# Patient Record
Sex: Female | Born: 1940
Health system: Southern US, Community
[De-identification: ages and names within clinical notes are randomized; demographics above are authoritative.]

## PROBLEM LIST (undated history)

## (undated) DIAGNOSIS — I10 Essential (primary) hypertension: Secondary | ICD-10-CM

## (undated) DIAGNOSIS — E785 Hyperlipidemia, unspecified: Secondary | ICD-10-CM

## (undated) DIAGNOSIS — E079 Disorder of thyroid, unspecified: Secondary | ICD-10-CM

## (undated) DIAGNOSIS — F419 Anxiety disorder, unspecified: Secondary | ICD-10-CM

## (undated) HISTORY — DX: Anxiety disorder, unspecified: F41.9

## (undated) HISTORY — DX: Disorder of thyroid, unspecified: E07.9

## (undated) HISTORY — DX: Essential (primary) hypertension: I10

## (undated) HISTORY — DX: Hyperlipidemia, unspecified: E78.5

## (undated) HISTORY — PX: TUBAL LIGATION: SHX77

---

## 2000-12-21 ENCOUNTER — Encounter: Payer: Self-pay | Admitting: Internal Medicine

## 2000-12-21 LAB — CONVERTED CEMR LAB: Pap Smear: NORMAL

## 2004-02-15 ENCOUNTER — Encounter: Payer: Self-pay | Admitting: Internal Medicine

## 2010-05-01 ENCOUNTER — Encounter: Payer: Self-pay | Admitting: Internal Medicine

## 2010-05-01 DIAGNOSIS — F39 Unspecified mood [affective] disorder: Secondary | ICD-10-CM

## 2010-05-01 DIAGNOSIS — I10 Essential (primary) hypertension: Secondary | ICD-10-CM | POA: Insufficient documentation

## 2010-05-02 ENCOUNTER — Ambulatory Visit: Payer: Self-pay | Admitting: Internal Medicine

## 2010-05-02 DIAGNOSIS — R002 Palpitations: Secondary | ICD-10-CM

## 2010-05-02 LAB — HM MAMMOGRAPHY

## 2010-09-01 ENCOUNTER — Ambulatory Visit: Payer: Self-pay | Admitting: Internal Medicine

## 2010-09-01 DIAGNOSIS — E039 Hypothyroidism, unspecified: Secondary | ICD-10-CM

## 2010-12-24 ENCOUNTER — Telehealth: Payer: Self-pay | Admitting: Family Medicine

## 2011-01-18 LAB — CONVERTED CEMR LAB
ALT: 20 units/L (ref 0–35)
AST: 30 units/L (ref 0–37)
Alkaline Phosphatase: 70 units/L (ref 39–117)
Bilirubin, Direct: 0.2 mg/dL (ref 0.0–0.3)
Creatinine, Ser: 0.6 mg/dL (ref 0.4–1.2)
Eosinophils Relative: 3 % (ref 0.0–5.0)
GFR calc non Af Amer: 109.58 mL/min (ref >60)
Glucose, Bld: 93 mg/dL (ref 70–99)
Lymphocytes Relative: 26.3 % (ref 12.0–46.0)
Monocytes Absolute: 0.6 10*3/uL (ref 0.1–1.0)
Monocytes Relative: 5.5 % (ref 3.0–12.0)
Neutrophils Relative %: 64.7 % (ref 43.0–77.0)
Phosphorus: 3.4 mg/dL (ref 2.3–4.6)
Platelets: 263 10*3/uL (ref 150.0–400.0)
Potassium: 4.1 meq/L (ref 3.5–5.1)
RBC: 4.31 M/uL (ref 3.87–5.11)
Sodium: 141 meq/L (ref 135–145)
Total Bilirubin: 1.3 mg/dL — ABNORMAL HIGH (ref 0.3–1.2)
WBC: 11.5 10*3/uL — ABNORMAL HIGH (ref 4.5–10.5)

## 2011-01-20 NOTE — Assessment & Plan Note (Signed)
Summary: 3-4 MONTH FOLLOW UPR/BH   Vital Signs:  Patient profile:   70 year old female Weight:      164 pounds Temp:     98.2 degrees F oral Pulse rate:   64 / minute Pulse rhythm:   regular BP sitting:   140 / 80  (left arm) Cuff size:   regular  Vitals Entered By: Mervin Hack CMA Duncan Dull) (September 01, 2010 12:38 PM) CC: 3-22month follow-up   History of Present Illness: Has felt fine Doesn't check BP but feels normal No headaches No chest pain No SOB  Had been walking 2 miles per day but then had ankle problems (former injury) so has slowed down on it some Wears good support shoes  History of low thyroid many years ago Didn't tolerate the meds No skin or nail problems  anxiety is better since moving back to [place] still ges some symptoms Only has needed 2 of the anxiety pills   Allergies: No Known Drug Allergies  Past History:  Past medical, surgical, family and social histories (including risk factors) reviewed for relevance to current acute and chronic problems.  Past Medical History: Anxiety Hypertension-2002 Hypothyroidism  Past Surgical History: Reviewed history from 05/01/2010 and no changes required. Vaginal Delivery times 3 Tubal ligation-after third delivery  Family History: Reviewed history from 05/02/2010 and no changes required. Father died at 66 from respiratory problems Mother deceased at 47, CAD Two brothers, one sister CAD in daughter (after diet pills) HTN-- -Son and died of kidney failure Breast cancer -- Maternal Grandmother and Maternal Aunt Colon Cancer: none  Social History: Reviewed history from 05/02/2010 and no changes required. Retired as IT consultant and various other businesses Divorced: after widowed  Three other children Current Smoker: Occasionally, socially (1 pack per week) Alcohol use-yes, Wine occ  Has living will. Daughter Mackey Birchwood has health care POA. Would accept resuscitation would not  want feeding tube  Review of Systems       had bout of sciatica about 10 years ago Remembers being on thyroid med many years ago--made her sick sleeps okay  Physical Exam  General:  alert and normal appearance.   Neck:  supple, no masses, no thyromegaly, no carotid bruits, and no cervical lymphadenopathy.   Lungs:  normal respiratory effort, no intercostal retractions, no accessory muscle use, and normal breath sounds.   Heart:  normal rate, regular rhythm, no murmur, and no gallop.   Abdomen:  soft and non-tender.   Extremities:  no edema Psych:  normally interactive, good eye contact, not anxious appearing, and not depressed appearing.     Impression & Recommendations:  Problem # 1:  HYPERTENSION (ICD-401.9) Assessment Unchanged fine without meds no need to restart  BP today: 140/80 Prior BP: 120/80 (05/02/2010)  Labs Reviewed: K+: 4.1 (05/02/2010) Creat: : 0.6 (05/02/2010)     Problem # 2:  HYPOTHYROIDISM (ICD-244.9) Assessment: Comment Only  past history didn't tolerate meds only will Rx if free T4 is low  Orders: Venipuncture ([address]) TLB-T4 (Thyrox), Free [phone])  Problem # 3:  ANXIETY (ICD-300.00) Assessment: Improved better now since back in [place] rarely needs the meds  Her updated medication list for this problem includes:    Clonazepam 1 Mg Tabs (Clonazepam) .Marland Kitchen... Take one tablet by mouth daily as needed for nerves  Complete Medication List: 1)  Clonazepam 1 Mg Tabs (Clonazepam) .... Take one tablet by mouth daily as needed for nerves 2)  Calcium 500 Mg Tabs (Calcium) .... Take one tablet  by mouth daily  Patient Instructions: 1)  Please schedule a follow-up appointment in 1 year for annual wellness visit  Current Allergies (reviewed today): No known allergies

## 2011-01-20 NOTE — Assessment & Plan Note (Signed)
Summary: PT TO RE-ESTABLISH/CLE   Vital Signs:  Patient profile:   70 year old female Height:      59 inches Weight:      162 pounds BMI:     32.84 Temp:     98.3 degrees F oral Pulse rate:   82 / minute Pulse rhythm:   regular BP sitting:   120 / 80  (left arm) Cuff size:   regular  Vitals Entered By: Mervin Hack CMA Duncan Dull) (2010/05/31 11:44 AM) CC: patient to re-establish care   History of Present Illness: Had moved to [place] 5 years ago to take care of dad He now died and she is back in area  she is generally healthy Vegetarian and careful with diet Plans to join gym and start exercise program  has still been on lisinopril for HTN was changed to 10mg  about 2 years ago wonders about whether she needs this  anxiety is only intermittent uses tranquilizer very rarely  takes a number of vitamin supplements as well  Allergies: No Known Drug Allergies  Past History:  Past Medical History: Last updated: 05/01/2010 Anxiety Hypertension-2002  Past Surgical History: Last updated: 05/01/2010 Vaginal Delivery times 3 Tubal ligation-after third delivery  Family History: Last updated: May 31, 2010 Father died at 2 from respiratory problems Mother deceased at 4, CAD Two brothers, one sister CAD in daughter (after diet pills) HTN-- -Son and died of kidney failure Breast cancer -- Maternal Grandmother and Maternal Aunt Colon Cancer: none  Social History: Last updated: 05/31/2010 Retired as IT consultant and various other businesses Divorced: after widowed  Three other children Current Smoker: Occasionally, socially (1 pack per week) Alcohol use-yes, Wine occ  Has living will. Daughter Mackey Birchwood has health care POA. Would accept resuscitation would not want feeding tube  Family History: Father died at 55 from respiratory problems Mother deceased at 11, CAD Two brothers, one sister CAD in daughter (after diet pills) HTN-- -Son and died of kidney  failure Breast cancer -- Maternal Grandmother and Maternal Aunt Colon Cancer: none  Social History: Retired as IT consultant and various other businesses Divorced: after widowed  Three other children Current Smoker: Occasionally, socially (1 pack per week) Alcohol use-yes, Wine occ  Has living will. Daughter Mackey Birchwood has health care POA. Would accept resuscitation would not want feeding tube  Review of Systems General:  weight down 25# in past 5 years Sleeps well wears seat blet. Eyes:  Denies double vision and vision loss-1 eye. ENT:  Complains of ringing in ears; denies decreased hearing; occ tinnitus Partial upper denture--regular with dentist. CV:  Complains of palpitations; denies chest pain or discomfort, difficulty breathing at night, difficulty breathing while lying down, fainting, lightheadness, and shortness of breath with exertion; only gets shaky and palpitations with anxiety problems (since son's death). Resp:  Denies cough and shortness of breath. GI:  Denies abdominal pain, bloody stools, change in bowel habits, dark tarry stools, indigestion, nausea, and vomiting. GU:  Denies dysuria and incontinence. MS:  Denies joint pain and joint swelling. Derm:  Denies lesion(s) and rash. Neuro:  Denies headaches, numbness, tingling, and weakness. Psych:  Complains of anxiety; denies depression. Heme:  Denies abnormal bruising and enlarge lymph nodes. Allergy:  Denies seasonal allergies and sneezing.  Contraindications/Deferment of Procedures/Staging:    Test/Procedure: Colonoscopy    Reason for deferment: patient declined     Test/Procedure: PAP Smear    Reason for deferment: patient declined     Test/Procedure: FLU VAX  Reason for deferment: patient declined     Test/Procedure: Pneumovax vaccine    Reason for deferment: patient declined     Test/Procedure: Mammogram    Reason for deferment: patient declined   Physical Exam  General:  alert and normal  appearance.   Eyes:  pupils equal, pupils round, and pupils reactive to light.   Mouth:  no erythema, no exudates, and no lesions.   Neck:  supple, no masses, no thyromegaly, no carotid bruits, and no cervical lymphadenopathy.   Lungs:  normal respiratory effort and normal breath sounds.   Heart:  normal rate, regular rhythm, no murmur, and no gallop.   Abdomen:  soft and non-tender.   Msk:  no joint tenderness and no joint swelling.   Pulses:  1+ in feet Extremities:  no edema Neurologic:  alert & oriented X3, strength normal in all extremities, and gait normal.   Skin:  no rashes and no suspicious lesions.   Axillary Nodes:  No palpable lymphadenopathy Psych:  normally interactive, good eye contact, not anxious appearing, and not depressed appearing.     Impression & Recommendations:  Problem # 1:  HYPERTENSION (ICD-401.9) Assessment Comment Only BP has been good on low dose--actually taking only 10mg   will try off and then recheck  The following medications were removed from the medication list:    Lisinopril 20 Mg Tabs (Lisinopril) .Marland Kitchen... Take one tablet by mouth daily  BP today: 120/80  Problem # 2:  PALPITATIONS (ICD-785.1) Assessment: New  seems to be just related to anxiety will check labs  Orders: EKG w/ Interpretation ([address]) TLB-Renal Function Panel (80069-RENAL) TLB-CBC Platelet - w/Differential (85025-CBCD) TLB-Hepatic/Liver Function Pnl (80076-HEPATIC) TLB-TSH (Thyroid Stimulating Hormone) (84443-TSH) Venipuncture (98119)  Problem # 3:  ANXIETY (ICD-300.00) Assessment: Unchanged uses meds only occ  Her updated medication list for this problem includes:    Clonazepam 1 Mg Tabs (Clonazepam) .Marland Kitchen... Take one tablet by mouth daily as needed for nerves  Complete Medication List: 1)  Clonazepam 1 Mg Tabs (Clonazepam) .... Take one tablet by mouth daily as needed for nerves 2)  Calcium 500 Mg Tabs (Calcium) .... Take one tablet by mouth daily  Patient  Instructions: 1)  Please schedule a follow-up appointment in 3-4  months .  Prescriptions: CLONAZEPAM 1 MG TABS (CLONAZEPAM) take one tablet by mouth daily as needed for nerves  #30 x 0   Entered and Authorized by:   Cindee Salt MD   Signed by:   Cindee Salt MD on 05/02/2010   Method used:   Print then Give to Patient   RxID:   [id number]   Current Allergies (reviewed today): No known allergies    EKG  Procedure date:  05/02/2010  Findings:      sinus @72  nonspecific inferolateral T wave flattening No comparison

## 2011-01-22 NOTE — Progress Notes (Signed)
Summary: clonazepam   Phone Note Refill Request Message from:  Patient on December 24, 2010 9:05 AM  Refills Requested: Medication #1:  CLONAZEPAM 1 MG TABS take one tablet by mouth daily as needed for nerves [facility] [address]   Initial call taken by: Melody Comas,  December 24, 2010 9:06 AM  Follow-up for Phone Call        ok to call in. Follow-up by: Eustaquio Boyden  MD,  December 24, 2010 9:07 AM  Additional Follow-up for Phone Call Additional follow up Details #1::        Rx called in as directed.  Additional Follow-up by: Janee Morn CMA (AAMA),  December 24, 2010 10:07 AM    Prescriptions: CLONAZEPAM 1 MG TABS (CLONAZEPAM) take one tablet by mouth daily as needed for nerves  #30 x 0   Entered and Authorized by:   Eustaquio Boyden  MD   Signed by:   Eustaquio Boyden  MD on 12/24/2010   Method used:   Telephoned to ...       [facility] (retail)       [address]       [place], [place]  [address]       Ph: [phone]       Fax: [phone]   RxID:   [id number]

## 2011-06-22 ENCOUNTER — Other Ambulatory Visit: Payer: Self-pay | Admitting: *Deleted

## 2011-06-22 MED ORDER — CLONAZEPAM 1 MG PO TABS: 1.0000 mg | ORAL_TABLET | Freq: Every day | ORAL | Status: DC | PRN

## 2011-06-22 NOTE — Telephone Encounter (Signed)
Okay #30 x 0 Should set up follow up in the next few months

## 2011-06-22 NOTE — Telephone Encounter (Signed)
rx called into pharmacy

## 2011-06-22 NOTE — Telephone Encounter (Signed)
Ok to refill klonopin 

## 2011-11-16 ENCOUNTER — Other Ambulatory Visit: Payer: Self-pay | Admitting: Internal Medicine

## 2011-11-16 NOTE — Telephone Encounter (Signed)
Spoke with patient and advised appt needed in earlier phone call.

## 2011-11-16 NOTE — Telephone Encounter (Signed)
Spoke with patient about scheduling and appointment and she states she's really busy and will call us when she can. Please advise.

## 2011-11-16 NOTE — Telephone Encounter (Signed)
We cannot refill a controlled substance without a visit She hasn't been seen in over a year

## 2011-11-24 ENCOUNTER — Telehealth: Payer: Self-pay | Admitting: Internal Medicine

## 2011-11-24 NOTE — Telephone Encounter (Signed)
Made an appointment for tomorrow for patient to be seen.

## 2011-11-25 ENCOUNTER — Encounter: Payer: Self-pay | Admitting: Internal Medicine

## 2011-11-25 ENCOUNTER — Ambulatory Visit (INDEPENDENT_AMBULATORY_CARE_PROVIDER_SITE_OTHER): Payer: MEDICARE | Admitting: Internal Medicine

## 2011-11-25 DIAGNOSIS — E039 Hypothyroidism, unspecified: Secondary | ICD-10-CM

## 2011-11-25 DIAGNOSIS — I10 Essential (primary) hypertension: Secondary | ICD-10-CM

## 2011-11-25 DIAGNOSIS — F411 Generalized anxiety disorder: Secondary | ICD-10-CM

## 2011-11-25 LAB — CBC WITH DIFFERENTIAL/PLATELET
Basophils Absolute: 0 10*3/uL (ref 0.0–0.1)
Eosinophils Absolute: 0.3 10*3/uL (ref 0.0–0.7)
Hemoglobin: 15 g/dL (ref 12.0–15.0)
Lymphocytes Relative: 30.5 % (ref 12.0–46.0)
MCHC: 33.7 g/dL (ref 30.0–36.0)
Neutro Abs: 5.3 10*3/uL (ref 1.4–7.7)
Neutrophils Relative %: 59.3 % (ref 43.0–77.0)
RDW: 13.1 % (ref 11.5–14.6)

## 2011-11-25 LAB — BASIC METABOLIC PANEL
BUN: 9 mg/dL (ref 6–23)
CO2: 27 mEq/L (ref 19–32)
Calcium: 9 mg/dL (ref 8.4–10.5)
Creatinine, Ser: 0.5 mg/dL (ref 0.4–1.2)
Glucose, Bld: 94 mg/dL (ref 70–99)

## 2011-11-25 LAB — HEPATIC FUNCTION PANEL
ALT: 33 U/L (ref 0–35)
Total Bilirubin: 0.9 mg/dL (ref 0.3–1.2)

## 2011-11-25 MED ORDER — CLONAZEPAM 1 MG PO TABS: 0.5000 mg | ORAL_TABLET | Freq: Two times a day (BID) | ORAL | Status: DC | PRN

## 2011-11-25 NOTE — Assessment & Plan Note (Signed)
Has had persistent low levels Could be influencing blood pressure Will start dose and double if still low

## 2011-11-25 NOTE — Assessment & Plan Note (Signed)
Uses clonazepam very occ Will refill

## 2011-11-25 NOTE — Progress Notes (Signed)
  Subjective:    Patient ID: Daisy Long, female    DOB: 04-Aug-1941, 70 y.o.   MRN: [id number]  HPI Called yesterday and staff note urinary symptoms She doesn't think she has infection but she does have a pressure sensation after voiding (like there is still urine there) Goes back at least 2 months No dysuria, abd pain or hematuria  Doesn't check BP No headaches No chest pain No SOB No set exercise---just does household tasks  Reviewed hypothyroidism diagnosis Felt terrible on synthroid then---"it made me feel terrible" She stopped it   Had quit smoking but still smokes occ Not daily  Gets occ anxiety Uses the clonazepam prn Hasn't needed refill since July Has to move soon--expects more stress with this  Does not want cancer screening No bowel problems No blood in stool No breast masses  Current Outpatient Prescriptions on File Prior to Visit  Medication Sig Dispense Refill  . clonazePAM (KLONOPIN) 1 MG tablet Take 1 tablet (1 mg total) by mouth daily as needed.  30 tablet  0    No Known Allergies  Past Medical History  Diagnosis Date  . Anxiety   . Hypertension   . Thyroid disease     Past Surgical History  Procedure Date  . Tubal ligation   . Vaginal delivery     X3    Family History  Problem Relation Age of Onset  . Coronary artery disease Mother   . Coronary artery disease Daughter   . Breast cancer Maternal Aunt   . Breast cancer Maternal Grandmother     History   Social History  . Marital Status: Single    Spouse Name: N/A    Number of Children: 3  . Years of Education: N/A   Occupational History  . retired as IT consultant    Social History Main Topics  . Smoking status: Current Some Day Smoker    Types: Cigarettes  . Smokeless tobacco: Never Used  . Alcohol Use: Yes  . Drug Use: No  . Sexually Active: Not on file   Other Topics Concern  . Not on file   Social History Narrative   Has living will. Daughter Daisy Long has  health care POA.Would accept resuscitationwould not want feeding tube   Review of Systems Vegan--eats well Stable weight--had gained 10# and then lost it again Sleeps okay    Objective:   Physical Exam  Constitutional: She appears well-developed and well-nourished. No distress.  Neck: Normal range of motion. Neck supple. No thyromegaly present.  Cardiovascular: Normal rate, regular rhythm, normal heart sounds and intact distal pulses.  Exam reveals no gallop.   No murmur heard. Pulmonary/Chest: Effort normal and breath sounds normal. No respiratory distress. She has no wheezes. She has no rales.  Abdominal: Soft. There is no tenderness.  Musculoskeletal: Normal range of motion. She exhibits no edema and no tenderness.  Lymphadenopathy:    She has no cervical adenopathy.  Psychiatric: She has a normal mood and affect. Her behavior is normal. Judgment and thought content normal.          Assessment & Plan:

## 2011-11-25 NOTE — Assessment & Plan Note (Signed)
BP Readings from Last 3 Encounters:  11/25/11 176/99  09/01/10 140/80  05/02/10 120/80   Repeat by me 160/98 Discussed that rx will be necessary Needs to work on lifestyle and weight loss Check urine microal---start lisinopril 10 again if abnormal Recheck 3 months

## 2011-11-30 ENCOUNTER — Telehealth: Payer: Self-pay | Admitting: *Deleted

## 2011-11-30 ENCOUNTER — Encounter: Payer: Self-pay | Admitting: *Deleted

## 2011-11-30 MED ORDER — LEVOTHYROXINE SODIUM 50 MCG PO TABS: 50.0000 ug | ORAL_TABLET | Freq: Every day | ORAL | Status: DC

## 2011-11-30 NOTE — Telephone Encounter (Signed)
Spoke with patient and advised results rx sent to pharmacy by e-script letter mailed to patients home address with results.  

## 2011-11-30 NOTE — Telephone Encounter (Signed)
Message copied by Sueanne Margarita on Mon Nov 30, 2011 10:05 AM ------      Message from: Tillman Abide I      Created: Fri Nov 27, 2011  8:01 AM       Please call      The thyroid test is still low and shows evidence of increased brain stimulation of the thyroid to keep up--which may be influencing her blood pressure      I recommend starting levothyroxine 50 mcg----take 1/2 tab for 2 weeks then increase to full tab (1 year Rx)      Keep the follow up            The urine test shows no signs of kidney damage from the blood pressure so we can hold off on BP meds for now      Liver tests are fine      Blood count is normal

## 2011-12-03 ENCOUNTER — Emergency Department: Payer: Self-pay | Admitting: Unknown Physician Specialty

## 2011-12-07 ENCOUNTER — Ambulatory Visit (INDEPENDENT_AMBULATORY_CARE_PROVIDER_SITE_OTHER): Payer: MEDICARE | Admitting: Internal Medicine

## 2011-12-07 ENCOUNTER — Encounter: Payer: Self-pay | Admitting: Internal Medicine

## 2011-12-07 DIAGNOSIS — I10 Essential (primary) hypertension: Secondary | ICD-10-CM

## 2011-12-07 DIAGNOSIS — R002 Palpitations: Secondary | ICD-10-CM

## 2011-12-07 DIAGNOSIS — E039 Hypothyroidism, unspecified: Secondary | ICD-10-CM

## 2011-12-07 MED ORDER — LISINOPRIL 10 MG PO TABS: 5.0000 mg | ORAL_TABLET | Freq: Every day | ORAL | Status: DC

## 2011-12-07 NOTE — Assessment & Plan Note (Signed)
BP Readings from Last 3 Encounters:  12/07/11 138/70  11/25/11 176/99  09/01/10 140/80   Seemed to tolerate lisinopril but has run out Did take one yesterday

## 2011-12-07 NOTE — Assessment & Plan Note (Signed)
Had another clear cut reaction to levothyroxine Will hold off on other Rx This goes back 20 years Will recheck at next visit and also check T3---consider [place] thyroid

## 2011-12-07 NOTE — Assessment & Plan Note (Signed)
Not clear cut per her history today Was ER diagnosis though EKG is changed with some T wave inversions anteriorly Clinically doesn't seem to have CAD---will plan to recheck this at next visit

## 2011-12-07 NOTE — Progress Notes (Signed)
  Subjective:    Patient ID: Daisy Long, female    DOB: [birth year], 70 y.o.   MRN: [id number]  HPI Seen in ER due to palpitations last week ER records are reviewed Thinks it may have been related to starting very low dose of levothyroxine Weird feelings--had sense of head stuffiness and lightheadedness Slight blood in mucus when blew her nose  No trouble with heart No palpitations she feels---just a sense in her neck where her thyroid is No chest pain No SOB  Asks about [place] thyroid--Dr Enedina Finner in ER mentioned this I also mentioned cytomel  Current Outpatient Prescriptions on File Prior to Visit  Medication Sig Dispense Refill  . clonazePAM (KLONOPIN) 1 MG tablet Take 0.5 tablets (0.5 mg total) by mouth 2 (two) times daily as needed for anxiety.  30 tablet  0    Allergies  Allergen Reactions  . Levothyroxine Other (See Comments)    Increased blood pressure    Past Medical History  Diagnosis Date  . Anxiety   . Hypertension   . Thyroid disease     Past Surgical History  Procedure Date  . Tubal ligation   . Vaginal delivery     X3    Family History  Problem Relation Age of Onset  . Coronary artery disease Mother   . Coronary artery disease Daughter   . Breast cancer Maternal Aunt   . Breast cancer Maternal Grandmother     History   Social History  . Marital Status: Single    Spouse Name: N/A    Number of Children: 3  . Years of Education: N/A   Occupational History  . retired as IT consultant    Social History Main Topics  . Smoking status: Former Smoker    Types: Cigarettes  . Smokeless tobacco: Never Used  . Alcohol Use: Yes  . Drug Use: No  . Sexually Active: Not on file   Other Topics Concern  . Not on file   Social History Narrative   Has living will. Daughter Mackey Birchwood has health care POA.Would accept resuscitationwould not want feeding tube   Review of Systems Sleeping okay Appetite is okay     Objective:   Physical Exam    Constitutional: She appears well-developed and well-nourished. No distress.  Neck: Normal range of motion. Neck supple. No thyromegaly present.  Cardiovascular: Normal rate, regular rhythm and normal heart sounds.  Exam reveals no gallop.   No murmur heard. Pulmonary/Chest: Effort normal and breath sounds normal. No respiratory distress. She has no wheezes. She has no rales.  Abdominal: Soft. There is no tenderness.  Musculoskeletal: She exhibits no edema and no tenderness.  Lymphadenopathy:    She has no cervical adenopathy.  Psychiatric: She has a normal mood and affect. Her behavior is normal. Judgment and thought content normal.          Assessment & Plan:

## 2012-02-08 ENCOUNTER — Other Ambulatory Visit: Payer: Self-pay | Admitting: Internal Medicine

## 2012-02-08 NOTE — Telephone Encounter (Signed)
rx called into pharmacy

## 2012-02-08 NOTE — Telephone Encounter (Signed)
Okay #30 x 0 

## 2012-02-10 ENCOUNTER — Other Ambulatory Visit: Payer: Self-pay | Admitting: Internal Medicine

## 2012-02-23 ENCOUNTER — Ambulatory Visit: Payer: MEDICARE | Admitting: Internal Medicine

## 2012-03-22 ENCOUNTER — Other Ambulatory Visit: Payer: Self-pay | Admitting: Internal Medicine

## 2012-03-22 NOTE — Telephone Encounter (Signed)
Okay #30 x 0 Due for check up in May or June---please have her schedule

## 2012-03-22 NOTE — Telephone Encounter (Signed)
No answer at home number and no answering machine, left message with pharmacy that pt needs to schedule f/u appt rx called into pharmacy

## 2012-05-18 ENCOUNTER — Other Ambulatory Visit: Payer: Self-pay | Admitting: Internal Medicine

## 2012-05-18 NOTE — Telephone Encounter (Signed)
rx called into pharmacy Left message asking patient to schedule appt in next couple of months

## 2012-05-18 NOTE — Telephone Encounter (Signed)
Okay #30 x 0 Forgot to give her follow up after her last visit Is due for visit within the next couple of months

## 2012-07-25 ENCOUNTER — Other Ambulatory Visit: Payer: Self-pay | Admitting: Internal Medicine

## 2012-07-25 NOTE — Telephone Encounter (Signed)
Daisy Long ( please send back to [facility])  Pt's canceled last appt on 02/23/12, last seen 12/07/11, ok to refill? No future appts scheduled

## 2012-07-25 NOTE — Telephone Encounter (Signed)
rx called into pharmacy

## 2012-07-25 NOTE — Telephone Encounter (Signed)
Ok to refill #30, 0 refills in Dr. Karle Starch absence

## 2012-09-22 ENCOUNTER — Other Ambulatory Visit: Payer: Self-pay | Admitting: Family Medicine

## 2012-09-22 NOTE — Telephone Encounter (Signed)
Electronic refill request

## 2012-09-25 NOTE — Telephone Encounter (Signed)
Please call in

## 2012-09-26 NOTE — Telephone Encounter (Signed)
Medication phoned to pharmacy.  

## 2012-12-08 ENCOUNTER — Other Ambulatory Visit: Payer: Self-pay | Admitting: *Deleted

## 2012-12-09 NOTE — Telephone Encounter (Signed)
Okay #30 x 0 Have her set up appt--preferably physical

## 2012-12-09 NOTE — Telephone Encounter (Signed)
She hasn't been seen in 1 year. Will defer to PCP.

## 2012-12-12 MED ORDER — CLONAZEPAM 1 MG PO TABS: ORAL_TABLET | ORAL | Status: DC

## 2012-12-12 NOTE — Telephone Encounter (Signed)
Spoke with patient and advised results rx called into pharmacy appt scheduled 12/20/12 @ 10;30

## 2012-12-20 ENCOUNTER — Encounter: Payer: Self-pay | Admitting: Internal Medicine

## 2012-12-20 ENCOUNTER — Ambulatory Visit (INDEPENDENT_AMBULATORY_CARE_PROVIDER_SITE_OTHER): Payer: Medicare Other | Admitting: Internal Medicine

## 2012-12-20 VITALS — BP 130/90 | HR 84 | Temp 98.5°F | Ht 59.0 in | Wt 163.0 lb

## 2012-12-20 DIAGNOSIS — E039 Hypothyroidism, unspecified: Secondary | ICD-10-CM

## 2012-12-20 DIAGNOSIS — F411 Generalized anxiety disorder: Secondary | ICD-10-CM

## 2012-12-20 DIAGNOSIS — Z0000 Encounter for general adult medical examination without abnormal findings: Secondary | ICD-10-CM

## 2012-12-20 DIAGNOSIS — I10 Essential (primary) hypertension: Secondary | ICD-10-CM

## 2012-12-20 LAB — HEPATIC FUNCTION PANEL
Albumin: 4.2 g/dL (ref 3.5–5.2)
Alkaline Phosphatase: 63 U/L (ref 39–117)
Total Protein: 8.1 g/dL (ref 6.0–8.3)

## 2012-12-20 LAB — CBC WITH DIFFERENTIAL/PLATELET
Basophils Absolute: 0 10*3/uL (ref 0.0–0.1)
Basophils Relative: 0.5 % (ref 0.0–3.0)
Eosinophils Relative: 3.8 % (ref 0.0–5.0)
Lymphocytes Relative: 28.8 % (ref 12.0–46.0)
Monocytes Absolute: 0.7 10*3/uL (ref 0.1–1.0)
Neutrophils Relative %: 59.9 % (ref 43.0–77.0)
Platelets: 280 10*3/uL (ref 150.0–400.0)
RBC: 4.25 Mil/uL (ref 3.87–5.11)
WBC: 9.5 10*3/uL (ref 4.5–10.5)

## 2012-12-20 LAB — LIPID PANEL
HDL: 38.9 mg/dL — ABNORMAL LOW (ref >39.00)
VLDL: 48.8 mg/dL — ABNORMAL HIGH (ref 0.0–40.0)

## 2012-12-20 LAB — BASIC METABOLIC PANEL
BUN: 14 mg/dL (ref 6–23)
Calcium: 9.2 mg/dL (ref 8.4–10.5)
Creatinine, Ser: 0.6 mg/dL (ref 0.4–1.2)

## 2012-12-20 MED ORDER — LISINOPRIL 10 MG PO TABS: 5.0000 mg | ORAL_TABLET | Freq: Every day | ORAL | Status: DC

## 2012-12-20 NOTE — Assessment & Plan Note (Signed)
Healthy Declines all imms and cancer screening Stressed the importance of work up if any bowel changes, breast mass, etc

## 2012-12-20 NOTE — Assessment & Plan Note (Signed)
Has not done well with meds If no sig change from last year, will not treat since she feels great

## 2012-12-20 NOTE — Assessment & Plan Note (Signed)
Does well with occ clonazepam

## 2012-12-20 NOTE — Assessment & Plan Note (Signed)
BP Readings from Last 3 Encounters:  12/20/12 130/90  12/07/11 138/70  11/25/11 176/99   Good control on med No changes needed Due for labs

## 2012-12-20 NOTE — Progress Notes (Signed)
Subjective:    Patient ID: Daisy Long, female    DOB: 11-11-1941, 71 y.o.   MRN: [id number]  HPI Here for physical  Reviewed immunization and cancer screening She has declined all these interventions  Took the thyroid med for only 3 days  Stopped due to marked elevated BP Feels fine now  Checks her BP regularly at home Usually 135-140/80 or so On the lisinopril  Has some degree of anxiety Relates the worsening to her son's death (8 years ago) but even in her childhood had problems Uses the clonazepam every other day or so  Follows Karp Ornish's lifestyle measures Regular exercise  Current Outpatient Prescriptions on File Prior to Visit  Medication Sig Dispense Refill  . clonazePAM (KLONOPIN) 1 MG tablet TAKE ONE-HALF TABLET BY MOUTH TWICE DAILY AS NEEDED FOR ANXIETY  30 tablet  0  . lisinopril (PRINIVIL,ZESTRIL) 10 MG tablet Take 0.5 tablets (5 mg total) by mouth daily.  90 tablet  3    Allergies  Allergen Reactions  . Levothyroxine Other (See Comments)    Increased blood pressure    Past Medical History  Diagnosis Date  . Anxiety   . Hypertension   . Thyroid disease     Past Surgical History  Procedure Date  . Tubal ligation   . Vaginal delivery     X3    Family History  Problem Relation Age of Onset  . Coronary artery disease Mother   . Coronary artery disease Daughter   . Breast cancer Maternal Aunt   . Breast cancer Maternal Grandmother     History   Social History  . Marital Status: Widowed    Spouse Name: N/A    Number of Children: 3  . Years of Education: N/A   Occupational History  . retired as IT consultant    Social History Main Topics  . Smoking status: Former Smoker    Types: Cigarettes  . Smokeless tobacco: Never Used  . Alcohol Use: Yes  . Drug Use: No  . Sexually Active: Not on file   Other Topics Concern  . Not on file   Social History Narrative   Has living will. Daughter Mackey Birchwood has health care POA.Would  accept resuscitation but would not want prolonged artificial ventilationwould not want feeding tube    Review of Systems  Constitutional: Negative for fatigue and unexpected weight change.       Wears seat belt  HENT: Positive for dental problem and tinnitus. Negative for congestion and rhinorrhea.        Intermittent tinnitis--- may be worse after vitamin C Regular with dentist--some trouble with bottom teeth  Eyes: Negative for visual disturbance.       No diplopia or unilateral vision loss Recent exam  Respiratory: Negative for cough, chest tightness and shortness of breath.   Cardiovascular: Positive for palpitations. Negative for chest pain and leg swelling.       Palpitations occ after drinking wine (flutter)  Gastrointestinal: Negative for nausea, vomiting, abdominal pain, constipation and blood in stool.       No heartburn  Genitourinary: Negative for dysuria, urgency and difficulty urinating.       No sex--no problem  Musculoskeletal: Negative for back pain, joint swelling and arthralgias.  Skin: Negative for rash.       No suspicious lesions  Neurological: Negative for dizziness, syncope, weakness, light-headedness, numbness and headaches.  Hematological: Negative for adenopathy. Does not bruise/bleed easily.  Psychiatric/Behavioral: Negative for sleep disturbance and dysphoric  mood. The patient is nervous/anxious.        Objective:   Physical Exam  Constitutional: She is oriented to person, place, and time. She appears well-developed and well-nourished. No distress.  HENT:  Head: Normocephalic and atraumatic.  Right Ear: External ear normal.  Left Ear: External ear normal.  Mouth/Throat: Oropharynx is clear and moist. No oropharyngeal exudate.  Eyes: Conjunctivae normal and EOM are normal. Pupils are equal, round, and reactive to light.  Neck: Normal range of motion. Neck supple. No thyromegaly present.  Cardiovascular: Normal rate, regular rhythm, normal heart sounds  and intact distal pulses.  Exam reveals no gallop.   No murmur heard. Pulmonary/Chest: Effort normal and breath sounds normal. No respiratory distress. She has no wheezes. She has no rales.  Abdominal: Soft. There is no tenderness.  Genitourinary:       Mild cystic changes bilaterally in breasts but no worrisome masses  Musculoskeletal: Normal range of motion. She exhibits no edema and no tenderness.  Lymphadenopathy:    She has no cervical adenopathy.    She has no axillary adenopathy.  Neurological: She is alert and oriented to person, place, and time.  Skin: No rash noted. No erythema.  Psychiatric: She has a normal mood and affect. Her behavior is normal.          Assessment & Plan:

## 2012-12-23 ENCOUNTER — Other Ambulatory Visit: Payer: Self-pay | Admitting: Internal Medicine

## 2012-12-26 NOTE — Telephone Encounter (Signed)
Last refilled 12/08/12, is this too soon?

## 2012-12-26 NOTE — Telephone Encounter (Signed)
Yeah Check with her to see what happened

## 2012-12-26 NOTE — Telephone Encounter (Signed)
Spoke with patient and she never requested refill,

## 2013-01-16 ENCOUNTER — Other Ambulatory Visit: Payer: Self-pay | Admitting: Internal Medicine

## 2013-01-16 NOTE — Telephone Encounter (Signed)
Okay #30 x 1 

## 2013-01-16 NOTE — Telephone Encounter (Signed)
rx called into pharmacy

## 2013-01-16 NOTE — Telephone Encounter (Signed)
rx already called in

## 2013-03-22 ENCOUNTER — Other Ambulatory Visit: Payer: Self-pay | Admitting: Internal Medicine

## 2013-03-22 NOTE — Telephone Encounter (Signed)
rx called into pharmacy

## 2013-03-22 NOTE — Telephone Encounter (Signed)
Okay #30 x 1 

## 2013-05-22 ENCOUNTER — Other Ambulatory Visit: Payer: Self-pay | Admitting: Internal Medicine

## 2013-05-22 NOTE — Telephone Encounter (Signed)
Okay #30 x 1 

## 2013-05-22 NOTE — Telephone Encounter (Signed)
rx called into pharmacy

## 2013-07-21 ENCOUNTER — Other Ambulatory Visit: Payer: Self-pay | Admitting: Internal Medicine

## 2013-07-21 NOTE — Telephone Encounter (Signed)
rx called into pharmacy

## 2013-07-21 NOTE — Telephone Encounter (Signed)
Okay #30 x 1 

## 2013-07-31 ENCOUNTER — Encounter: Payer: Self-pay | Admitting: Radiology

## 2013-08-01 ENCOUNTER — Ambulatory Visit (INDEPENDENT_AMBULATORY_CARE_PROVIDER_SITE_OTHER): Payer: Medicare Other | Admitting: Internal Medicine

## 2013-08-01 ENCOUNTER — Encounter: Payer: Self-pay | Admitting: Internal Medicine

## 2013-08-01 VITALS — BP 140/80 | HR 80 | Temp 98.5°F | Wt 150.0 lb

## 2013-08-01 DIAGNOSIS — N8111 Cystocele, midline: Secondary | ICD-10-CM

## 2013-08-01 DIAGNOSIS — N8110 Cystocele, unspecified: Secondary | ICD-10-CM | POA: Insufficient documentation

## 2013-08-01 NOTE — Progress Notes (Signed)
  Subjective:    Patient ID: Daisy Long, female    DOB: Nov 24, 1941, 72 y.o.   MRN: [id number]  HPI Noticed a protrusion from vaginal--feels some pressure History of fibroids in past No pain No bleeding  Current Outpatient Prescriptions on File Prior to Visit  Medication Sig Dispense Refill  . clonazePAM (KLONOPIN) 1 MG tablet TAKE ONE-HALF TABLET BY MOUTH TWICE DAILY AS NEEDED  30 tablet  1  . lisinopril (PRINIVIL,ZESTRIL) 10 MG tablet Take 0.5 tablets (5 mg total) by mouth daily.  90 tablet  3   No current facility-administered medications on file prior to visit.    Allergies  Allergen Reactions  . Levothyroxine Other (See Comments)    Increased blood pressure    Past Medical History  Diagnosis Date  . Anxiety   . Hypertension   . Thyroid disease     Past Surgical History  Procedure Laterality Date  . Tubal ligation    . Vaginal delivery      X3    Family History  Problem Relation Age of Onset  . Coronary artery disease Mother   . Coronary artery disease Daughter   . Breast cancer Maternal Aunt   . Breast cancer Maternal Grandmother     History   Social History  . Marital Status: Widowed    Spouse Name: N/A    Number of Children: 3  . Years of Education: N/A   Occupational History  . retired as IT consultant    Social History Main Topics  . Smoking status: Former Smoker    Types: Cigarettes  . Smokeless tobacco: Never Used  . Alcohol Use: Yes  . Drug Use: No  . Sexually Active: Not on file   Other Topics Concern  . Not on file   Social History Narrative   Has living will. Daughter Mackey Birchwood has health care POA.   Would accept resuscitation but would not want prolonged artificial ventilation   would not want feeding tube                Review of Systems Not sexually active No urinary symptoms    Objective:   Physical Exam  Genitourinary:  Introitus appears fairly normal ?prominence just below the urethra Cervix in normal  position Uterus felt well and no apparent enlargement          Assessment & Plan:

## 2013-08-01 NOTE — Assessment & Plan Note (Signed)
Seems to be partial but bothering her (sensation) Clearly no uterine prolapse  Will proceed with gyn eval

## 2013-08-22 ENCOUNTER — Other Ambulatory Visit: Payer: Self-pay | Admitting: Internal Medicine

## 2013-08-22 NOTE — Telephone Encounter (Signed)
rx called into pharmacy The refill was not on the original rx per the pharmacy.

## 2013-08-22 NOTE — Telephone Encounter (Signed)
Had #30 with a refill done 8/1 Make sure they haven't filled the refill also If not, okay to fill #30 x 0 Will not give refills on this even with low number

## 2013-09-15 ENCOUNTER — Telehealth: Payer: Self-pay

## 2013-09-15 DIAGNOSIS — N8110 Cystocele, unspecified: Secondary | ICD-10-CM

## 2013-09-15 NOTE — Telephone Encounter (Signed)
Tina at Dr DeFrancisco office left v/m; pt was seen on 09/12/13 and Dr DeFrancisco recommended a surgical procedure TDH with anterior colopography(? Spelling); at appt pt was interested in surgical procedure; pts daughter called Dr Gar Ponto office and said pt is not interested in surgical procedure and does not want to return to Dr DeFrancisco for further care. Inetta Fermo is not sure if pt is going to continue the treatment Dr DeFrancisco recommended such as estrace cream.Wanted Dr Alphonsus Sias to be aware since Dr Alphonsus Sias did referral.

## 2013-09-15 NOTE — Telephone Encounter (Signed)
Discussed with daughter I felt her prolapse was mild and no reason to rush into surgery  Will refer to [facility] for a second opinion

## 2013-09-15 NOTE — Telephone Encounter (Signed)
Daisy Long pts daughter said Dr DeFrancisco told pt early into the office visit said pt needed to have uterus removed because she does not need it anymore. Daisy Long said after he examined pt he said he still wanted to do hysterectomy and get a persuary. Pt is not satisfied with the advice pt was given and does not feel like he answered pt's questions and request a referral to another specialist. Daisy Long request cb.

## 2013-09-19 NOTE — Telephone Encounter (Signed)
Appt made with [facility] and patient notified.

## 2013-09-20 ENCOUNTER — Other Ambulatory Visit: Payer: Self-pay | Admitting: Internal Medicine

## 2013-09-21 NOTE — Telephone Encounter (Signed)
Last filled 08/22/13 

## 2013-09-21 NOTE — Telephone Encounter (Signed)
Okay #30 x 0 

## 2013-09-22 NOTE — Telephone Encounter (Signed)
rx called into pharmacy

## 2013-10-20 ENCOUNTER — Other Ambulatory Visit: Payer: Self-pay | Admitting: Internal Medicine

## 2013-10-20 NOTE — Telephone Encounter (Signed)
Electronic refill request, please advise  

## 2013-10-21 NOTE — Telephone Encounter (Signed)
Okay #30 x 0 

## 2013-10-23 NOTE — Telephone Encounter (Signed)
rx called into pharmacy

## 2013-10-26 ENCOUNTER — Other Ambulatory Visit: Payer: Self-pay

## 2013-10-30 ENCOUNTER — Ambulatory Visit: Payer: Self-pay | Admitting: Obstetrics and Gynecology

## 2013-10-30 LAB — COMPREHENSIVE METABOLIC PANEL
Alkaline Phosphatase: 89 U/L (ref 50–136)
Anion Gap: 3 — ABNORMAL LOW (ref 7–16)
BUN: 7 mg/dL (ref 7–18)
Bilirubin,Total: 1 mg/dL (ref 0.2–1.0)
Calcium, Total: 8.6 mg/dL (ref 8.5–10.1)
Co2: 30 mmol/L (ref 21–32)
Creatinine: 0.52 mg/dL — ABNORMAL LOW (ref 0.60–1.30)
EGFR (African American): >60
Glucose: 87 mg/dL (ref 65–99)
Potassium: 4 mmol/L (ref 3.5–5.1)
SGPT (ALT): 24 U/L (ref 12–78)
Sodium: 138 mmol/L (ref 136–145)
Total Protein: 7.1 g/dL (ref 6.4–8.2)

## 2013-10-30 LAB — CBC
HCT: 39.5 % (ref 35.0–47.0)
HGB: 13.8 g/dL (ref 12.0–16.0)
MCH: 35.5 pg — ABNORMAL HIGH (ref 26.0–34.0)
MCHC: 35 g/dL (ref 32.0–36.0)
RDW: 12.3 % (ref 11.5–14.5)

## 2013-11-20 ENCOUNTER — Other Ambulatory Visit: Payer: Self-pay | Admitting: Internal Medicine

## 2013-11-20 NOTE — Telephone Encounter (Signed)
rx called into pharmacy

## 2013-11-20 NOTE — Telephone Encounter (Signed)
Okay #30 x 0 

## 2013-11-20 NOTE — Telephone Encounter (Signed)
Last filled 10/20/13

## 2013-12-22 ENCOUNTER — Other Ambulatory Visit: Payer: Self-pay | Admitting: Internal Medicine

## 2013-12-22 ENCOUNTER — Encounter: Payer: Medicare Other | Admitting: Internal Medicine

## 2013-12-22 NOTE — Telephone Encounter (Signed)
rx called into pharmacy

## 2013-12-22 NOTE — Telephone Encounter (Signed)
Ok to phone in klonipin 

## 2013-12-22 NOTE — Telephone Encounter (Signed)
Last filled 11/20/13 LETVAK PATIENT, Please send back to me for call in

## 2013-12-29 ENCOUNTER — Encounter: Payer: Self-pay | Admitting: Internal Medicine

## 2013-12-29 ENCOUNTER — Ambulatory Visit (INDEPENDENT_AMBULATORY_CARE_PROVIDER_SITE_OTHER): Payer: Medicare Other | Admitting: Internal Medicine

## 2013-12-29 VITALS — BP 130/80 | HR 64 | Temp 98.6°F | Ht 59.0 in | Wt 145.0 lb

## 2013-12-29 DIAGNOSIS — I10 Essential (primary) hypertension: Secondary | ICD-10-CM

## 2013-12-29 DIAGNOSIS — E785 Hyperlipidemia, unspecified: Secondary | ICD-10-CM

## 2013-12-29 DIAGNOSIS — E039 Hypothyroidism, unspecified: Secondary | ICD-10-CM

## 2013-12-29 DIAGNOSIS — F411 Generalized anxiety disorder: Secondary | ICD-10-CM

## 2013-12-29 DIAGNOSIS — Z0000 Encounter for general adult medical examination without abnormal findings: Secondary | ICD-10-CM

## 2013-12-29 LAB — HEPATIC FUNCTION PANEL
ALBUMIN: 4.2 g/dL (ref 3.5–5.2)
ALT: 24 U/L (ref 0–35)
AST: 33 U/L (ref 0–37)
Alkaline Phosphatase: 59 U/L (ref 39–117)
BILIRUBIN TOTAL: 1.4 mg/dL — AB (ref 0.3–1.2)
Bilirubin, Direct: 0.1 mg/dL (ref 0.0–0.3)
Total Protein: 7.8 g/dL (ref 6.0–8.3)

## 2013-12-29 LAB — BASIC METABOLIC PANEL
BUN: 9 mg/dL (ref 6–23)
CALCIUM: 9.1 mg/dL (ref 8.4–10.5)
CO2: 29 mEq/L (ref 19–32)
CREATININE: 0.6 mg/dL (ref 0.4–1.2)
Chloride: 103 mEq/L (ref 96–112)
GFR: 96.79 mL/min (ref >60.00)
GLUCOSE: 96 mg/dL (ref 70–99)
POTASSIUM: 4.1 meq/L (ref 3.5–5.1)
Sodium: 139 mEq/L (ref 135–145)

## 2013-12-29 LAB — CBC WITH DIFFERENTIAL/PLATELET
Basophils Absolute: 0 10*3/uL (ref 0.0–0.1)
Basophils Relative: 0.4 % (ref 0.0–3.0)
EOS ABS: 0.3 10*3/uL (ref 0.0–0.7)
Eosinophils Relative: 3.4 % (ref 0.0–5.0)
HCT: 43.3 % (ref 36.0–46.0)
Hemoglobin: 14.6 g/dL (ref 12.0–15.0)
Lymphocytes Relative: 28.1 % (ref 12.0–46.0)
Lymphs Abs: 2.4 10*3/uL (ref 0.7–4.0)
MCHC: 33.7 g/dL (ref 30.0–36.0)
MCV: 101.8 fl — ABNORMAL HIGH (ref 78.0–100.0)
MONO ABS: 0.6 10*3/uL (ref 0.1–1.0)
Monocytes Relative: 6.7 % (ref 3.0–12.0)
NEUTROS PCT: 61.4 % (ref 43.0–77.0)
Neutro Abs: 5.3 10*3/uL (ref 1.4–7.7)
Platelets: 251 10*3/uL (ref 150.0–400.0)
RBC: 4.25 Mil/uL (ref 3.87–5.11)
RDW: 12.2 % (ref 11.5–14.6)
WBC: 8.7 10*3/uL (ref 4.5–10.5)

## 2013-12-29 LAB — MICROALBUMIN / CREATININE URINE RATIO
CREATININE, U: 214.8 mg/dL
MICROALB UR: 0.9 mg/dL (ref 0.0–1.9)
Microalb Creat Ratio: 0.4 mg/g (ref 0.0–30.0)

## 2013-12-29 LAB — T4, FREE: FREE T4: 0.7 ng/dL (ref 0.60–1.60)

## 2013-12-29 LAB — LIPID PANEL
CHOL/HDL RATIO: 6
Cholesterol: 243 mg/dL — ABNORMAL HIGH (ref 0–200)
HDL: 39.3 mg/dL (ref >39.00)
Triglycerides: 135 mg/dL (ref 0.0–149.0)
VLDL: 27 mg/dL (ref 0.0–40.0)

## 2013-12-29 LAB — LDL CHOLESTEROL, DIRECT: LDL DIRECT: 180.3 mg/dL

## 2013-12-29 LAB — TSH: TSH: 7.78 u[IU]/mL — ABNORMAL HIGH (ref 0.35–5.50)

## 2013-12-29 NOTE — Progress Notes (Signed)
Subjective:    Patient ID: Daisy Long, female    DOB: Oct 23, 1941, 73 y.o.   MRN: [id number]  HPI Here for physical  Has some concerns about her meds Concerned about side effects with the clonazepam Was taking 1/2 bid--- now cut down to 1/2 daily Had some feelings of dizziness and not feeling right Now taking 1/4 tab once a day--with some skipped days No problems with the anxiety--chronic but not enough for meds  Not happy with the lisinopril Thinks it causes her tinnitus and she doesn't feel right Checking at home-- 140/75 and sometimes lower  Went to Dr Jean Rosenthal for second opinion on bladder Didn't push surgery No major symptoms so just observing for now  Current Outpatient Prescriptions on File Prior to Visit  Medication Sig Dispense Refill  . clonazePAM (KLONOPIN) 1 MG tablet TAKE ONE-HALF TABLET BY MOUTH TWICE DAILY AS NEEDED  30 tablet  0  . lisinopril (PRINIVIL,ZESTRIL) 10 MG tablet Take 0.5 tablets (5 mg total) by mouth daily.  90 tablet  3   No current facility-administered medications on file prior to visit.    Allergies  Allergen Reactions  . Levothyroxine Other (See Comments)    Increased blood pressure    Past Medical History  Diagnosis Date  . Anxiety   . Hypertension   . Thyroid disease   . Hyperlipidemia     Past Surgical History  Procedure Laterality Date  . Tubal ligation    . Vaginal delivery      X3    Family History  Problem Relation Age of Onset  . Coronary artery disease Mother   . Coronary artery disease Daughter   . Breast cancer Maternal Aunt   . Breast cancer Maternal Grandmother     History   Social History  . Marital Status: Widowed    Spouse Name: N/A    Number of Children: 3  . Years of Education: N/A   Occupational History  . retired as IT consultant    Social History Main Topics  . Smoking status: Former Smoker    Types: Cigarettes  . Smokeless tobacco: Never Used  . Alcohol Use: Yes  . Drug Use: No    . Sexual Activity: Not on file   Other Topics Concern  . Not on file   Social History Narrative   Has living will.    Daughter Mackey Birchwood has health care POA.   Would accept resuscitation but would not want prolonged artificial ventilation   Would not want feeding tube if cognitively unaware                  Review of Systems  Constitutional: Negative for fatigue.       Has lost more weight with Danielle Dess plan Wears seat belt  HENT: Positive for dental problem and tinnitus. Negative for congestion, hearing loss and rhinorrhea.        Needs some work on bottom teeth  Eyes: Negative for visual disturbance.       No diplopia or unilateral vision loss  Respiratory: Negative for cough, chest tightness and shortness of breath.   Cardiovascular: Negative for chest pain, palpitations and leg swelling.  Gastrointestinal: Negative for nausea, vomiting, abdominal pain, constipation and blood in stool.       No heartburn  Endocrine: Negative for cold intolerance and heat intolerance.  Genitourinary: Negative for dysuria, hematuria and difficulty urinating.  Musculoskeletal: Negative for arthralgias, back pain and joint swelling.  Skin: Negative for  rash.       No suspicious lesions  Allergic/Immunologic: Negative for environmental allergies and immunocompromised state.  Neurological: Negative for dizziness, syncope, weakness, numbness and headaches.  Hematological: Negative for adenopathy. Does not bruise/bleed easily.  Psychiatric/Behavioral: Negative for sleep disturbance and dysphoric mood. The patient is nervous/anxious.        Objective:   Physical Exam  Constitutional: She is oriented to person, place, and time. She appears well-developed and well-nourished. No distress.  HENT:  Head: Normocephalic and atraumatic.  Right Ear: External ear normal.  Left Ear: External ear normal.  Mouth/Throat: Oropharynx is clear and moist. No oropharyngeal exudate.  Eyes: Conjunctivae and  EOM are normal. Pupils are equal, round, and reactive to light.  Neck: Normal range of motion. Neck supple. No thyromegaly present.  Cardiovascular: Normal rate, regular rhythm and normal heart sounds.  Exam reveals no gallop.   No murmur heard. Faint pedal pulses  Pulmonary/Chest: Effort normal and breath sounds normal. No respiratory distress. She has no wheezes. She has no rales.  Abdominal: Soft. There is no tenderness.  Musculoskeletal: She exhibits no tenderness.  Lymphadenopathy:    She has no cervical adenopathy.  Neurological: She is alert and oriented to person, place, and time.  Skin: No rash noted. No erythema.  Psychiatric: She has a normal mood and affect. Her behavior is normal.          Assessment & Plan:

## 2013-12-29 NOTE — Assessment & Plan Note (Signed)
No symptoms ?try Armour thyroid if TSH up a lot

## 2013-12-29 NOTE — Assessment & Plan Note (Signed)
Healthy Still doesn't want any immunizations or cancer screening

## 2013-12-29 NOTE — Assessment & Plan Note (Signed)
BP Readings from Last 3 Encounters:  12/29/13 130/80  08/01/13 140/80  12/20/12 130/90   Side effects with med Discussed goal of 150/90 Recheck 3 months

## 2013-12-29 NOTE — Assessment & Plan Note (Signed)
Better  Will try off the med

## 2013-12-29 NOTE — Assessment & Plan Note (Signed)
Has maximized lifestyle improvements Doesn't want a statin

## 2013-12-29 NOTE — Progress Notes (Signed)
Pre-visit discussion using our clinic review tool. No additional management support is needed unless otherwise documented below in the visit note.  

## 2014-03-01 ENCOUNTER — Emergency Department: Payer: Self-pay | Admitting: Emergency Medicine

## 2014-03-01 LAB — CBC WITH DIFFERENTIAL/PLATELET
BASOS PCT: 0.6 %
Basophil #: 0.1 10*3/uL (ref 0.0–0.1)
EOS PCT: 3.4 %
Eosinophil #: 0.4 10*3/uL (ref 0.0–0.7)
HCT: 43.2 % (ref 35.0–47.0)
HGB: 14.7 g/dL (ref 12.0–16.0)
Lymphocyte #: 2.7 10*3/uL (ref 1.0–3.6)
Lymphocyte %: 24.2 %
MCH: 35.2 pg — ABNORMAL HIGH (ref 26.0–34.0)
MCHC: 34.1 g/dL (ref 32.0–36.0)
MCV: 103 fL — ABNORMAL HIGH (ref 80–100)
MONOS PCT: 6.5 %
Monocyte #: 0.7 x10 3/mm (ref 0.2–0.9)
NEUTROS ABS: 7.4 10*3/uL — AB (ref 1.4–6.5)
Neutrophil %: 65.3 %
Platelet: 237 10*3/uL (ref 150–440)
RBC: 4.19 10*6/uL (ref 3.80–5.20)
RDW: 12.5 % (ref 11.5–14.5)
WBC: 11.3 10*3/uL — ABNORMAL HIGH (ref 3.6–11.0)

## 2014-03-01 LAB — BASIC METABOLIC PANEL
Anion Gap: 3 — ABNORMAL LOW (ref 7–16)
BUN: 14 mg/dL (ref 7–18)
CO2: 28 mmol/L (ref 21–32)
Calcium, Total: 8.6 mg/dL (ref 8.5–10.1)
Chloride: 106 mmol/L (ref 98–107)
Creatinine: 0.63 mg/dL (ref 0.60–1.30)
EGFR (African American): >60
GLUCOSE: 120 mg/dL — AB (ref 65–99)
Osmolality: 275 (ref 275–301)
Potassium: 3.8 mmol/L (ref 3.5–5.1)
Sodium: 137 mmol/L (ref 136–145)

## 2014-03-01 LAB — URINALYSIS, COMPLETE
BILIRUBIN, UR: NEGATIVE
BLOOD: NEGATIVE
Bacteria: NONE SEEN
GLUCOSE, UR: NEGATIVE mg/dL (ref 0–75)
KETONE: NEGATIVE
Nitrite: NEGATIVE
Ph: 5 (ref 4.5–8.0)
Protein: NEGATIVE
RBC,UR: 5 /HPF (ref 0–5)
Specific Gravity: 1.023 (ref 1.003–1.030)
Squamous Epithelial: 10
WBC UR: 11 /HPF (ref 0–5)

## 2014-03-01 LAB — MAGNESIUM: Magnesium: 1.6 mg/dL — ABNORMAL LOW

## 2014-03-01 LAB — TROPONIN I: Troponin-I: <0.02 ng/mL

## 2014-03-22 ENCOUNTER — Ambulatory Visit (INDEPENDENT_AMBULATORY_CARE_PROVIDER_SITE_OTHER): Payer: Medicare Other | Admitting: Internal Medicine

## 2014-03-22 ENCOUNTER — Encounter: Payer: Self-pay | Admitting: Internal Medicine

## 2014-03-22 VITALS — BP 142/80 | HR 78 | Temp 97.7°F | Wt 146.0 lb

## 2014-03-22 DIAGNOSIS — I10 Essential (primary) hypertension: Secondary | ICD-10-CM

## 2014-03-22 DIAGNOSIS — F411 Generalized anxiety disorder: Secondary | ICD-10-CM

## 2014-03-22 NOTE — Progress Notes (Signed)
   Subjective:    Patient ID: Daisy Long, female    DOB: Sep 20, 1941, 73 y.o.   MRN: [id number]  HPI Here with daughter Paulette   Off the BP med Has been monitoring it regularly Usually about 140/80  No headaches No chest pain No SOB Has had some spells of lightheadedness when she changed from her [place] diet. Actually went to ER once -- no problems on eval No problems since then  Anxiety has been fine No problems off the med  No current outpatient prescriptions on file prior to visit.   No current facility-administered medications on file prior to visit.    Allergies  Allergen Reactions  . Levothyroxine Other (See Comments)    Increased blood pressure    Past Medical History  Diagnosis Date  . Anxiety   . Hypertension   . Thyroid disease   . Hyperlipidemia     Past Surgical History  Procedure Laterality Date  . Tubal ligation    . Vaginal delivery      X3    Family History  Problem Relation Age of Onset  . Coronary artery disease Mother   . Coronary artery disease Daughter   . Breast cancer Maternal Aunt   . Breast cancer Maternal Grandmother     History   Social History  . Marital Status: Widowed    Spouse Name: N/A    Number of Children: 3  . Years of Education: N/A   Occupational History  . 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 Smoking status: Former Smoker    Types: Cigarettes  . Smokeless tobacco: Never Used  . Alcohol Use: Yes  . Drug Use: No  . Sexual Activity: Not on file   Other Topics Concern  . Not on file   Social History Narrative   Has living will.    Daughter Mackey Birchwoodaulette has health care POA.   Would accept resuscitation but would not want prolonged artificial ventilation   Would not want feeding tube if cognitively unaware                     Review of Systems Sleeps fine Appetite is good Weight is stable    Objective:   Physical Exam  Constitutional: She appears well-developed and  well-nourished. No distress.  Psychiatric: She has a normal mood and affect. Her behavior is normal.          Assessment & Plan:

## 2014-03-22 NOTE — Assessment & Plan Note (Signed)
BP Readings from Last 3 Encounters:  03/22/14 142/80  12/29/13 130/80  08/01/13 140/80   Good control Doing okay without the meds Some dizziness---seems to be if she misses meal, etc

## 2014-03-22 NOTE — Progress Notes (Signed)
Pre visit review using our clinic review tool, if applicable. No additional management support is needed unless otherwise documented below in the visit note. 

## 2014-03-22 NOTE — Assessment & Plan Note (Signed)
Quiet Doing well without meds

## 2015-03-25 ENCOUNTER — Encounter: Payer: Medicare Other | Admitting: Internal Medicine

## 2015-03-29 ENCOUNTER — Ambulatory Visit (INDEPENDENT_AMBULATORY_CARE_PROVIDER_SITE_OTHER): Payer: Medicare Other | Admitting: Internal Medicine

## 2015-03-29 ENCOUNTER — Encounter: Payer: Self-pay | Admitting: Internal Medicine

## 2015-03-29 VITALS — BP 130/78 | HR 67 | Temp 98.4°F | Ht 59.0 in | Wt 144.0 lb

## 2015-03-29 DIAGNOSIS — E039 Hypothyroidism, unspecified: Secondary | ICD-10-CM

## 2015-03-29 DIAGNOSIS — Z7189 Other specified counseling: Secondary | ICD-10-CM

## 2015-03-29 DIAGNOSIS — E038 Other specified hypothyroidism: Secondary | ICD-10-CM

## 2015-03-29 DIAGNOSIS — I10 Essential (primary) hypertension: Secondary | ICD-10-CM | POA: Diagnosis not present

## 2015-03-29 DIAGNOSIS — Z0000 Encounter for general adult medical examination without abnormal findings: Secondary | ICD-10-CM | POA: Diagnosis not present

## 2015-03-29 DIAGNOSIS — E785 Hyperlipidemia, unspecified: Secondary | ICD-10-CM | POA: Diagnosis not present

## 2015-03-29 DIAGNOSIS — F39 Unspecified mood [affective] disorder: Secondary | ICD-10-CM

## 2015-03-29 LAB — COMPREHENSIVE METABOLIC PANEL
ALT: 18 U/L (ref 0–35)
AST: 24 U/L (ref 0–37)
Albumin: 4.4 g/dL (ref 3.5–5.2)
Alkaline Phosphatase: 60 U/L (ref 39–117)
BUN: 14 mg/dL (ref 6–23)
CHLORIDE: 102 meq/L (ref 96–112)
CO2: 28 mEq/L (ref 19–32)
CREATININE: 0.58 mg/dL (ref 0.40–1.20)
Calcium: 9.5 mg/dL (ref 8.4–10.5)
GFR: 108.06 mL/min (ref >60.00)
Glucose, Bld: 96 mg/dL (ref 70–99)
Potassium: 4 mEq/L (ref 3.5–5.1)
Sodium: 139 mEq/L (ref 135–145)
Total Bilirubin: 0.9 mg/dL (ref 0.2–1.2)
Total Protein: 7.9 g/dL (ref 6.0–8.3)

## 2015-03-29 LAB — CBC WITH DIFFERENTIAL/PLATELET
Basophils Absolute: 0 10*3/uL (ref 0.0–0.1)
Basophils Relative: 0.5 % (ref 0.0–3.0)
EOS PCT: 4.5 % (ref 0.0–5.0)
Eosinophils Absolute: 0.4 10*3/uL (ref 0.0–0.7)
HCT: 43.5 % (ref 36.0–46.0)
Hemoglobin: 14.8 g/dL (ref 12.0–15.0)
LYMPHS PCT: 27 % (ref 12.0–46.0)
Lymphs Abs: 2.4 10*3/uL (ref 0.7–4.0)
MCHC: 34 g/dL (ref 30.0–36.0)
MCV: 101.7 fl — ABNORMAL HIGH (ref 78.0–100.0)
MONO ABS: 0.5 10*3/uL (ref 0.1–1.0)
Monocytes Relative: 5.9 % (ref 3.0–12.0)
NEUTROS PCT: 62.1 % (ref 43.0–77.0)
Neutro Abs: 5.6 10*3/uL (ref 1.4–7.7)
PLATELETS: 246 10*3/uL (ref 150.0–400.0)
RBC: 4.28 Mil/uL (ref 3.87–5.11)
RDW: 12.5 % (ref 11.5–15.5)
WBC: 9.1 10*3/uL (ref 4.0–10.5)

## 2015-03-29 LAB — LIPID PANEL
CHOL/HDL RATIO: 4
Cholesterol: 239 mg/dL — ABNORMAL HIGH (ref 0–200)
HDL: 55.9 mg/dL (ref >39.00)
LDL Cholesterol: 154 mg/dL — ABNORMAL HIGH (ref 0–99)
NonHDL: 183.1
Triglycerides: 144 mg/dL (ref 0.0–149.0)
VLDL: 28.8 mg/dL (ref 0.0–40.0)

## 2015-03-29 LAB — TSH: TSH: 10.14 u[IU]/mL — ABNORMAL HIGH (ref 0.35–4.50)

## 2015-03-29 LAB — T4, FREE: Free T4: 0.69 ng/dL (ref 0.60–1.60)

## 2015-03-29 MED ORDER — ALPRAZOLAM 0.25 MG PO TABS: 0.2500 mg | ORAL_TABLET | Freq: Two times a day (BID) | ORAL | Status: DC | PRN

## 2015-03-29 NOTE — Assessment & Plan Note (Signed)
BP Readings from Last 3 Encounters:  03/29/15 130/78  03/22/14 142/80  12/29/13 130/80   Doing well with just lifestyle measures

## 2015-03-29 NOTE — Assessment & Plan Note (Signed)
Seems euthyroid If TSH over 10, would consider low dose Armour thyroid (but she is reluctant for this also)

## 2015-03-29 NOTE — Assessment & Plan Note (Signed)
See social history 

## 2015-03-29 NOTE — Assessment & Plan Note (Signed)
Episodic anxiety spells Will give small amount alprazolam

## 2015-03-29 NOTE — Assessment & Plan Note (Signed)
I have personally reviewed the Medicare Annual Wellness questionnaire and have noted 1. The patient's medical and social history 2. Their use of alcohol, tobacco or illicit drugs 3. Their current medications and supplements 4. The patient's functional ability including ADL's, fall risks, home safety risks and hearing or visual             impairment. 5. Diet and physical activities 6. Evidence for depression or mood disorders  The patients weight, height, BMI and visual acuity have been recorded in the chart I have made referrals, counseling and provided education to the patient based review of the above and I have provided the pt with a written personalized care plan for preventive services.  I have provided you with a copy of your personalized plan for preventive services. Please take the time to review along with your updated medication list.  She still refuses all vaccines She prefers no mammogram or colon cancer screening Discussed fitness---she is working on it

## 2015-03-29 NOTE — Assessment & Plan Note (Signed)
Adamantly doesn't want statin

## 2015-03-29 NOTE — Progress Notes (Signed)
Pre visit review using our clinic review tool, if applicable. No additional management support is needed unless otherwise documented below in the visit note. 

## 2015-03-29 NOTE — Progress Notes (Signed)
Subjective:    Patient ID: Daisy Long, female    DOB: 1941/04/23, 74 y.o.   MRN: [id number]  HPI Here for Medicare wellness visit and follow up of chronic medical conditions Reviewed form and advanced directives No other doctors Walks regularly and has joined gym No falls Hearing and vision are fine Independent with instrumental ADLs No apparent cognitive problems Daily glass of wine --- 3-4 days per week No tobacco  Has had some problems with anxiety May get really upset and shaky at times--- wants something for emergency use No sadness or depression Sleeps well Appetite is fine  No chest pain No dizziness or syncope No edema No headaches  No fatigue No skin, hair or nail changes Doesn't feel thyroid  No current outpatient prescriptions on file prior to visit.   No current facility-administered medications on file prior to visit.    Allergies  Allergen Reactions  . Levothyroxine Other (See Comments)    Increased blood pressure    Past Medical History  Diagnosis Date  . Anxiety   . Hypertension   . Thyroid disease   . Hyperlipidemia     Past Surgical History  Procedure Laterality Date  . Tubal ligation    . Vaginal delivery      X3    Family History  Problem Relation Age of Onset  . Coronary artery disease Mother   . Coronary artery disease Daughter   . Breast cancer Maternal Aunt   . Breast cancer Maternal Grandmother     History   Social History  . Marital Status: Widowed    Spouse Name: N/A  . Number of Children: 3  . Years of Education: N/A   Occupational History  . 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 Smoking status: Former Smoker    Types: Cigarettes  . Smokeless tobacco: Never Used  . Alcohol Use: Yes  . Drug Use: No  . Sexual Activity: Not on file   Other Topics Concern  . Not on file   Social History Narrative   Has living will.    Daughter Mackey Birchwoodaulette has health care POA.   Would accept  resuscitation but would not want prolonged artificial ventilation   Would not want feeding tube if cognitively unaware                   Review of Systems Weight is stable Wears seat belt Teeth are okay--does have partial lower denture causing some pain No joint pains or back pain (did have brief back problems after starting at gym) Bowels are fine. No blood No urinary incontinence    Objective:   Physical Exam  Constitutional: She is oriented to person, place, and time. She appears well-developed and well-nourished. No distress.  HENT:  Mouth/Throat: Oropharynx is clear and moist. No oropharyngeal exudate.  Neck: Normal range of motion. Neck supple. No thyromegaly present.  Cardiovascular: Normal rate, regular rhythm, normal heart sounds and intact distal pulses.  Exam reveals no gallop.   No murmur heard. Pulmonary/Chest: Effort normal and breath sounds normal. No respiratory distress. She has no wheezes. She has no rales.  Abdominal: Soft. There is no tenderness.  Musculoskeletal: She exhibits no edema or tenderness.  Lymphadenopathy:    She has no cervical adenopathy.  Neurological: She is alert and oriented to person, place, and time.  President-- "Obama, Renne MuscaBush, Bush, then Reagan" [id number] Recall 3/3  Skin: No rash noted. No erythema.  Psychiatric:  She has a normal mood and affect. Her behavior is normal.          Assessment & Plan:

## 2015-07-31 ENCOUNTER — Other Ambulatory Visit: Payer: Self-pay | Admitting: Internal Medicine

## 2015-07-31 NOTE — Telephone Encounter (Signed)
03/29/15 

## 2015-08-01 NOTE — Telephone Encounter (Signed)
rx called into pharmacy

## 2015-08-01 NOTE — Telephone Encounter (Signed)
Approved: okay #20 x 0 

## 2015-10-21 ENCOUNTER — Other Ambulatory Visit: Payer: Self-pay | Admitting: Internal Medicine

## 2015-10-21 NOTE — Telephone Encounter (Signed)
rx called into pharmacy

## 2015-10-21 NOTE — Telephone Encounter (Signed)
Approved: #20 x 0 

## 2015-10-21 NOTE — Telephone Encounter (Signed)
08/01/2015 

## 2016-03-30 ENCOUNTER — Ambulatory Visit (INDEPENDENT_AMBULATORY_CARE_PROVIDER_SITE_OTHER): Payer: Medicare Other | Admitting: Internal Medicine

## 2016-03-30 ENCOUNTER — Encounter: Payer: Self-pay | Admitting: Internal Medicine

## 2016-03-30 VITALS — BP 160/90 | HR 83 | Temp 98.7°F | Ht 58.75 in | Wt 143.0 lb

## 2016-03-30 DIAGNOSIS — Z7189 Other specified counseling: Secondary | ICD-10-CM

## 2016-03-30 DIAGNOSIS — F39 Unspecified mood [affective] disorder: Secondary | ICD-10-CM | POA: Diagnosis not present

## 2016-03-30 DIAGNOSIS — E038 Other specified hypothyroidism: Secondary | ICD-10-CM | POA: Diagnosis not present

## 2016-03-30 DIAGNOSIS — Z0000 Encounter for general adult medical examination without abnormal findings: Secondary | ICD-10-CM

## 2016-03-30 DIAGNOSIS — I10 Essential (primary) hypertension: Secondary | ICD-10-CM

## 2016-03-30 DIAGNOSIS — E039 Hypothyroidism, unspecified: Secondary | ICD-10-CM

## 2016-03-30 LAB — CBC WITH DIFFERENTIAL/PLATELET
BASOS ABS: 0 10*3/uL (ref 0.0–0.1)
Basophils Relative: 0.5 % (ref 0.0–3.0)
EOS ABS: 0.5 10*3/uL (ref 0.0–0.7)
Eosinophils Relative: 5 % (ref 0.0–5.0)
HEMATOCRIT: 42.3 % (ref 36.0–46.0)
Hemoglobin: 14.2 g/dL (ref 12.0–15.0)
LYMPHS ABS: 2.4 10*3/uL (ref 0.7–4.0)
LYMPHS PCT: 26.3 % (ref 12.0–46.0)
MCHC: 33.6 g/dL (ref 30.0–36.0)
MCV: 103.3 fl — AB (ref 78.0–100.0)
MONOS PCT: 5.9 % (ref 3.0–12.0)
Monocytes Absolute: 0.5 10*3/uL (ref 0.1–1.0)
NEUTROS PCT: 62.3 % (ref 43.0–77.0)
Neutro Abs: 5.7 10*3/uL (ref 1.4–7.7)
Platelets: 242 10*3/uL (ref 150.0–400.0)
RBC: 4.1 Mil/uL (ref 3.87–5.11)
RDW: 12.2 % (ref 11.5–15.5)
WBC: 9.2 10*3/uL (ref 4.0–10.5)

## 2016-03-30 LAB — COMPREHENSIVE METABOLIC PANEL
ALBUMIN: 4.4 g/dL (ref 3.5–5.2)
ALK PHOS: 58 U/L (ref 39–117)
ALT: 19 U/L (ref 0–35)
AST: 23 U/L (ref 0–37)
BILIRUBIN TOTAL: 0.7 mg/dL (ref 0.2–1.2)
BUN: 10 mg/dL (ref 6–23)
CO2: 29 mEq/L (ref 19–32)
Calcium: 9.6 mg/dL (ref 8.4–10.5)
Chloride: 102 mEq/L (ref 96–112)
Creatinine, Ser: 0.53 mg/dL (ref 0.40–1.20)
GFR: 119.58 mL/min (ref >60.00)
Glucose, Bld: 98 mg/dL (ref 70–99)
POTASSIUM: 4.3 meq/L (ref 3.5–5.1)
Sodium: 141 mEq/L (ref 135–145)
TOTAL PROTEIN: 7.8 g/dL (ref 6.0–8.3)

## 2016-03-30 LAB — TSH: TSH: 9.92 u[IU]/mL — AB (ref 0.35–4.50)

## 2016-03-30 LAB — T4, FREE: Free T4: 0.71 ng/dL (ref 0.60–1.60)

## 2016-03-30 MED ORDER — ALPRAZOLAM 0.25 MG PO TABS: 0.2500 mg | ORAL_TABLET | Freq: Two times a day (BID) | ORAL | Status: DC | PRN

## 2016-03-30 NOTE — Progress Notes (Signed)
Subjective:    Patient ID: Daisy Long, female    DOB: 04/17/1941, 75 y.o.   MRN: [id number] HPI Here for Medicare wellness and follow up of chronic health issues Reviewed form and advanced directives Reviewed other doctors Occasional wine Former smoker Vision is okay. Hearing is fine No falls No depression or anhedonia Independent with instrumental ADLs No problems with her memory  She feels well No indication that thyroid is a problem Still reluctant to take meds No change in skin, hair or nails  No chest pain  No SOB No dizziness Walks some every day--no change in exercise tolerance Uses gym at her community  Has rare mood issures Can get anxious and shaky but fairly uncommon Still has some of the original alprazolam  Current Outpatient Prescriptions on File Prior to Visit  Medication Sig Dispense Refill  . ALPRAZolam (XANAX) 0.25 MG tablet TAKE ONE TABLET BY MOUTH TWICE DAILY AS NEEDED FOR ANXIETY 20 tablet 0   No current facility-administered medications on file prior to visit.    Allergies  Allergen Reactions  . Levothyroxine Other (See Comments)    Increased blood pressure    Past Medical History  Diagnosis Date  . Anxiety   . Hypertension   . Thyroid disease   . Hyperlipidemia     Past Surgical History  Procedure Laterality Date  . Tubal ligation    . Vaginal delivery      X3    Family History  Problem Relation Age of Onset  . Coronary artery disease Mother   . Coronary artery disease Daughter   . Breast cancer Maternal Aunt   . Breast cancer Maternal Grandmother     Social History   Social History  . Marital Status: Widowed    Spouse Name: N/A  . Number of Children: 3  . Years of Education: N/A   Occupational History  . retired as IT consultant    Social History Main Topics  . Smoking status: Former Smoker    Types: Cigarettes  . Smokeless tobacco: Never Used  . Alcohol Use: Yes  . Drug Use: No  . Sexual Activity:  Not on file   Other Topics Concern  . Not on file   Social History Narrative   Has living will.    Daughter Mackey Birchwood has health care POA.   Would accept resuscitation but would not want prolonged artificial ventilation   Would not want feeding tube if cognitively unaware                  Review of Systems Appetite is good Weight stable Eats healthy Wears seat belt Bowels are fine No urinary problems No skin rash--- no suspicious lesions No headaches, dizziness or instability    Objective:   Physical Exam  Constitutional: She is oriented to person, place, and time. She appears well-developed and well-nourished. No distress.  HENT:  Mouth/Throat: Oropharynx is clear and moist. No oropharyngeal exudate.  Neck: Normal range of motion. Neck supple. No thyromegaly present.  Cardiovascular: Normal rate, regular rhythm, normal heart sounds and intact distal pulses.  Exam reveals no gallop.   No murmur heard. Pulmonary/Chest: Effort normal and breath sounds normal. No respiratory distress. She has no wheezes. She has no rales.  Musculoskeletal: She exhibits no edema or tenderness.  Lymphadenopathy:    She has no cervical adenopathy.  Neurological: She is alert and oriented to person, place, and time.  President-- "Garnet Koyanagi, Obama, Clinton" She refused rest of memory  test  Skin: No rash noted. No erythema.  Psychiatric: She has a normal mood and affect. Her behavior is normal.          Assessment & Plan:

## 2016-03-30 NOTE — Assessment & Plan Note (Signed)
I have personally reviewed the Medicare Annual Wellness questionnaire and have noted 1. The patient's medical and social history 2. Their use of alcohol, tobacco or illicit drugs 3. Their current medications and supplements 4. The patient's functional ability including ADL's, fall risks, home safety risks and hearing or visual             impairment. 5. Diet and physical activities 6. Evidence for depression or mood disorders  The patients weight, height, BMI and visual acuity have been recorded in the chart I have made referrals, counseling and provided education to the patient based review of the above and I have provided the pt with a written personalized care plan for preventive services.  I have provided you with a copy of your personalized plan for preventive services. Please take the time to review along with your updated medication list.  She doesn't want any immunizations or cancer screening Fairly healthy lifestyle

## 2016-03-30 NOTE — Assessment & Plan Note (Signed)
She has been resistant to Rx If TSH has gone up even more--will have her try low dose Armour thyoid

## 2016-03-30 NOTE — Assessment & Plan Note (Signed)
See social history 

## 2016-03-30 NOTE — Assessment & Plan Note (Signed)
Rare mood issues Keeps the alprazolam for rare use--will refill

## 2016-03-30 NOTE — Assessment & Plan Note (Signed)
BP Readings from Last 3 Encounters:  03/30/16 160/90  03/29/15 130/78  03/22/14 142/80   She is usually fine on the rare times she checks Doesn't want Rx Will monitor at home ?related to hypothyroidism

## 2016-03-30 NOTE — Addendum Note (Signed)
Addended by: Tillman AbideLETVAK, RICHARD I on: 03/30/2016 11:01 AM   Modules accepted: Orders

## 2016-03-30 NOTE — Progress Notes (Signed)
Pre visit review using our clinic review tool, if applicable. No additional management support is needed unless otherwise documented below in the visit note. 

## 2016-03-30 NOTE — Patient Instructions (Signed)
Check your blood pressure at least monthly. Let me know if it stays over 150/90.

## 2016-05-21 ENCOUNTER — Other Ambulatory Visit: Payer: Self-pay | Admitting: Internal Medicine

## 2016-05-22 NOTE — Telephone Encounter (Signed)
Approved: #20 x 0 

## 2016-05-22 NOTE — Telephone Encounter (Signed)
Left refill on voice mail at pharmacy  

## 2016-05-22 NOTE — Telephone Encounter (Signed)
Last filled 12-26-15 #20 Last OV 03-30-16 Next OV 03-31-17

## 2016-05-25 ENCOUNTER — Telehealth: Payer: Self-pay

## 2016-05-25 MED ORDER — TRIAMTERENE-HCTZ 37.5-25 MG PO TABS: 1.0000 | ORAL_TABLET | Freq: Every day | ORAL | Status: DC

## 2016-05-25 NOTE — Telephone Encounter (Signed)
Spoke to patient. Made an appointment for July 13 to follow-up

## 2016-05-25 NOTE — Telephone Encounter (Signed)
Please call her  She was not on medications mostly by her choice and she had some trouble with lisinopril in the past Find out if she is willing to retry a medication (different)---with BP that high, I think it is very important that she do I will send it if she agrees

## 2016-05-25 NOTE — Telephone Encounter (Signed)
Spoke to patient. She is willing to try a BP med. She said the medication issue was levothyroxine and not lisinopril. She is on anxiety medication. She is under a lot of stress right now.

## 2016-05-25 NOTE — Telephone Encounter (Signed)
Pt last seen 03/30/16; pt no longer taking med for BP per doctors instruction; pt has only taken BP one time since seen 03/30/16; BP on 05/23/16 was 175/72. No H/A, CP,dizziness or SOB. Pt does feel like fibromyalgia feeling in back and neck. Pt request cb. [facility] [address]

## 2016-05-25 NOTE — Telephone Encounter (Signed)
Have her split the first 3 tabs in half and start with half daily. If no problems, she can then increase to a full tab daily Set up follow up in 4-6 weeks. It is possible that BP will come down if stress eases up some. If she has dizziness---she shouldn't increase to the full tab

## 2016-05-28 MED ORDER — LISINOPRIL 10 MG PO TABS: 10.0000 mg | ORAL_TABLET | Freq: Every day | ORAL | Status: DC

## 2016-05-28 NOTE — Telephone Encounter (Signed)
Spoke to pt. Sent lisinopril to the pharmacy. Added triamterene-hctz to allergy list

## 2016-05-28 NOTE — Telephone Encounter (Signed)
Okay to change her to lisinopril 10mg  daily (1 year) Put down the diuretic as intolerance on allergy list Keep the planned follow up

## 2016-05-28 NOTE — Telephone Encounter (Addendum)
Pt left v/m; pt started taking the 1/2 tab as instructed; pt was nauseated and light headed.(have not listed under allergies and adverse reactions until Dr Alphonsus SiasLetvak advises pt what to do) Pt had taken lisinopril in the past and that did not bother pt with side effects. Pt wants to know if can take lisinopril or what to do. Pt request cb. [facility] [address].

## 2016-05-28 NOTE — Addendum Note (Signed)
Addended by: Eual FinesBRIDGES, Mustafa Potts P on: 05/28/2016 01:50 PM   Modules accepted: Orders, Medications

## 2016-06-23 ENCOUNTER — Other Ambulatory Visit: Payer: Self-pay | Admitting: Internal Medicine

## 2016-06-24 NOTE — Telephone Encounter (Signed)
Last filled 05-22-16 #20 Last OV 03-30-16 Next OV 03-31-17

## 2016-06-24 NOTE — Telephone Encounter (Signed)
Approved: #20 x 0 

## 2016-06-24 NOTE — Telephone Encounter (Signed)
Left refill on voice mail at pharmacy  

## 2016-07-02 ENCOUNTER — Ambulatory Visit (INDEPENDENT_AMBULATORY_CARE_PROVIDER_SITE_OTHER): Payer: Medicare Other | Admitting: Internal Medicine

## 2016-07-02 ENCOUNTER — Encounter: Payer: Self-pay | Admitting: Internal Medicine

## 2016-07-02 VITALS — BP 152/90 | HR 88 | Temp 98.5°F | Wt 148.0 lb

## 2016-07-02 DIAGNOSIS — I10 Essential (primary) hypertension: Secondary | ICD-10-CM

## 2016-07-02 MED ORDER — LISINOPRIL 20 MG PO TABS: 20.0000 mg | ORAL_TABLET | Freq: Every day | ORAL | Status: DC

## 2016-07-02 NOTE — Progress Notes (Signed)
Pre visit review using our clinic review tool, if applicable. No additional management support is needed unless otherwise documented below in the visit note. 

## 2016-07-02 NOTE — Assessment & Plan Note (Signed)
Tolerating the lisinopril Will increase to 20 Check renal Will go back to yearly follow up--she doesn't want to churn the visits. Discussed home monitoring--it makes her too upset and prefers not

## 2016-07-02 NOTE — Progress Notes (Signed)
   Subjective:    Patient ID: Daisy Long, female    DOB: [birth year], 75 y.o.   MRN: [id number]  HPI Here for follow up of HTN  Tolerating the lisinopril Had been on this before Hasn't checked BP at home No headaches---but does have some ache in muscles in back of neck at times (ASA helps if persists) No chest pain No SOB No dizziness No edema  Current Outpatient Prescriptions on File Prior to Visit  Medication Sig Dispense Refill  . ALPRAZolam (XANAX) 0.25 MG tablet TAKE ONE TABLET BY MOUTH TWICE DAILY AS NEEDED FOR ANXIETY 20 tablet 0  . lisinopril (PRINIVIL,ZESTRIL) 10 MG tablet Take 1 tablet (10 mg total) by mouth daily. 30 tablet 11   No current facility-administered medications on file prior to visit.    Allergies  Allergen Reactions  . Levothyroxine Other (See Comments)    Increased blood pressure  . Triamterene-Hctz     Past Medical History  Diagnosis Date  . Anxiety   . Hypertension   . Thyroid disease   . Hyperlipidemia     Past Surgical History  Procedure Laterality Date  . Tubal ligation    . Vaginal delivery      X3    Family History  Problem Relation Age of Onset  . Coronary artery disease Mother   . Coronary artery disease Daughter   . Breast cancer Maternal Aunt   . Breast cancer Maternal Grandmother     Social History   Social History  . Marital Status: Widowed    Spouse Name: N/A  . Number of Children: 3  . Years of Education: N/A   Occupational History  . 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 Smoking status: Former Smoker    Types: Cigarettes  . Smokeless tobacco: Never Used  . Alcohol Use: Yes  . Drug Use: No  . Sexual Activity: Not on file   Other Topics Concern  . Not on file   Social History Narrative   Has living will.    Daughter Mackey Birchwoodaulette has health care POA.   Would accept resuscitation but would not want prolonged artificial ventilation   Would not want feeding tube if cognitively  unaware                  Review of Systems  No throat symptoms  No cough     Objective:   Physical Exam  Constitutional: She appears well-developed and well-nourished. No distress.  Neck: Normal range of motion. Neck supple. No thyromegaly present.  Cardiovascular: Normal rate, regular rhythm and normal heart sounds.  Exam reveals no gallop.   No murmur heard. Pulmonary/Chest: Effort normal and breath sounds normal. No respiratory distress. She has no wheezes. She has no rales.  Lymphadenopathy:    She has no cervical adenopathy.          Assessment & Plan:

## 2016-07-03 LAB — RENAL FUNCTION PANEL
ALBUMIN: 4.3 g/dL (ref 3.5–5.2)
BUN: 13 mg/dL (ref 6–23)
CALCIUM: 9.1 mg/dL (ref 8.4–10.5)
CO2: 27 meq/L (ref 19–32)
CREATININE: 0.64 mg/dL (ref 0.40–1.20)
Chloride: 101 mEq/L (ref 96–112)
GFR: 96.13 mL/min (ref >60.00)
GLUCOSE: 88 mg/dL (ref 70–99)
POTASSIUM: 4.5 meq/L (ref 3.5–5.1)
Phosphorus: 3.8 mg/dL (ref 2.3–4.6)
Sodium: 139 mEq/L (ref 135–145)

## 2016-07-22 ENCOUNTER — Other Ambulatory Visit: Payer: Self-pay | Admitting: Internal Medicine

## 2016-07-22 NOTE — Telephone Encounter (Signed)
Approved: #20 x 0 

## 2016-07-22 NOTE — Telephone Encounter (Signed)
Left refill on voice mail at pharmacy  

## 2016-07-22 NOTE — Telephone Encounter (Signed)
Last filled 06-24-16 #20 Last OV 07-02-16 Next OV 03-31-17

## 2016-08-20 ENCOUNTER — Other Ambulatory Visit: Payer: Self-pay | Admitting: Internal Medicine

## 2016-08-20 NOTE — Telephone Encounter (Signed)
Left refill on voice mail at pharmacy  

## 2016-08-20 NOTE — Telephone Encounter (Signed)
Last filled 07-22-16 #20 Last OV 07-02-16 Next OV 03-31-17

## 2016-08-20 NOTE — Telephone Encounter (Signed)
Approved: #20 x 0 

## 2016-09-21 ENCOUNTER — Other Ambulatory Visit: Payer: Self-pay | Admitting: Internal Medicine

## 2016-09-21 NOTE — Telephone Encounter (Signed)
Last filled 08-20-16 #20 Last OV 07-02-16 Next OV 03-31-17

## 2016-09-21 NOTE — Telephone Encounter (Signed)
Approved: #20 x 0 

## 2016-09-21 NOTE — Telephone Encounter (Signed)
Left refill on voice mail at pharmacy  

## 2016-10-21 ENCOUNTER — Other Ambulatory Visit: Payer: Self-pay | Admitting: Internal Medicine

## 2016-10-21 NOTE — Telephone Encounter (Signed)
Last refill 09/21/16 #20, last OV 07/02/16. Ok to refill?

## 2016-10-22 ENCOUNTER — Other Ambulatory Visit: Payer: Self-pay | Admitting: Internal Medicine

## 2016-10-22 NOTE — Telephone Encounter (Signed)
Pt left v/m requesting status of alprazolam refill; spoke with [facility] [address] and refill is ready for pick up. Pt voiced understanding.

## 2016-10-22 NOTE — Telephone Encounter (Signed)
Medication phoned to pharmacy.  

## 2016-10-22 NOTE — Telephone Encounter (Signed)
Please call in.  Thanks.   

## 2016-11-23 ENCOUNTER — Other Ambulatory Visit: Payer: Self-pay | Admitting: Family Medicine

## 2016-11-23 NOTE — Telephone Encounter (Signed)
Electronic refill request. Last Filled:    20 tablet 0 10/22/2016  Last office visit:   07/02/16  Please advise.

## 2016-11-24 ENCOUNTER — Other Ambulatory Visit: Payer: Self-pay | Admitting: Internal Medicine

## 2016-11-24 NOTE — Telephone Encounter (Signed)
It is a duplicate . 

## 2016-11-24 NOTE — Telephone Encounter (Signed)
Last filled 09-21-16 #20 Last OV 07-02-16 Next OV 03-31-17

## 2016-11-24 NOTE — Telephone Encounter (Signed)
Left refill on voice mail at pharmacy  

## 2016-11-24 NOTE — Telephone Encounter (Signed)
Approved: #20 x 0 Is this a duplicate?

## 2016-11-24 NOTE — Telephone Encounter (Signed)
Approved: #20 x 0 

## 2016-12-26 ENCOUNTER — Other Ambulatory Visit: Payer: Self-pay | Admitting: Internal Medicine

## 2016-12-28 NOTE — Telephone Encounter (Signed)
Last filled 11-24-16 #20 Last OV 07-02-16 Next OV 03-31-17  Forwarding to Dr Patsy Lageropland in Dr Karle StarchLetvak's absence

## 2016-12-28 NOTE — Telephone Encounter (Signed)
Ok to refill 20, 0 ref 

## 2016-12-28 NOTE — Telephone Encounter (Signed)
Left refill on voice mail at pharmacy  

## 2017-01-28 ENCOUNTER — Other Ambulatory Visit: Payer: Self-pay | Admitting: Internal Medicine

## 2017-01-28 NOTE — Telephone Encounter (Signed)
Left refill on voice mail at pharmacy  

## 2017-01-28 NOTE — Telephone Encounter (Signed)
Approved: #20 x 0 

## 2017-01-28 NOTE — Telephone Encounter (Signed)
Last filled 12-28-16 #20 Last OV 07-02-16 Next OV 03-31-17

## 2017-03-31 ENCOUNTER — Encounter: Payer: Self-pay | Admitting: Internal Medicine

## 2017-03-31 ENCOUNTER — Ambulatory Visit (INDEPENDENT_AMBULATORY_CARE_PROVIDER_SITE_OTHER): Payer: Medicare Other | Admitting: Internal Medicine

## 2017-03-31 VITALS — BP 132/84 | HR 74 | Temp 98.3°F | Ht 59.0 in | Wt 139.0 lb

## 2017-03-31 DIAGNOSIS — E038 Other specified hypothyroidism: Secondary | ICD-10-CM

## 2017-03-31 DIAGNOSIS — I10 Essential (primary) hypertension: Secondary | ICD-10-CM | POA: Diagnosis not present

## 2017-03-31 DIAGNOSIS — E785 Hyperlipidemia, unspecified: Secondary | ICD-10-CM | POA: Diagnosis not present

## 2017-03-31 DIAGNOSIS — Z0000 Encounter for general adult medical examination without abnormal findings: Secondary | ICD-10-CM | POA: Diagnosis not present

## 2017-03-31 DIAGNOSIS — E039 Hypothyroidism, unspecified: Secondary | ICD-10-CM

## 2017-03-31 DIAGNOSIS — F39 Unspecified mood [affective] disorder: Secondary | ICD-10-CM

## 2017-03-31 DIAGNOSIS — Z7189 Other specified counseling: Secondary | ICD-10-CM

## 2017-03-31 LAB — COMPREHENSIVE METABOLIC PANEL
ALK PHOS: 59 U/L (ref 39–117)
ALT: 20 U/L (ref 0–35)
AST: 28 U/L (ref 0–37)
Albumin: 4.1 g/dL (ref 3.5–5.2)
BUN: 10 mg/dL (ref 6–23)
CO2: 28 mEq/L (ref 19–32)
Calcium: 9.3 mg/dL (ref 8.4–10.5)
Chloride: 103 mEq/L (ref 96–112)
Creatinine, Ser: 0.59 mg/dL (ref 0.40–1.20)
GFR: 105.38 mL/min (ref >60.00)
GLUCOSE: 91 mg/dL (ref 70–99)
POTASSIUM: 3.8 meq/L (ref 3.5–5.1)
Sodium: 140 mEq/L (ref 135–145)
Total Bilirubin: 1.2 mg/dL (ref 0.2–1.2)
Total Protein: 7.3 g/dL (ref 6.0–8.3)

## 2017-03-31 LAB — CBC WITH DIFFERENTIAL/PLATELET
Basophils Absolute: 0 10*3/uL (ref 0.0–0.1)
Basophils Relative: 0.5 % (ref 0.0–3.0)
EOS PCT: 5.1 % — AB (ref 0.0–5.0)
Eosinophils Absolute: 0.4 10*3/uL (ref 0.0–0.7)
HCT: 40.7 % (ref 36.0–46.0)
Hemoglobin: 13.8 g/dL (ref 12.0–15.0)
LYMPHS ABS: 2 10*3/uL (ref 0.7–4.0)
Lymphocytes Relative: 22.8 % (ref 12.0–46.0)
MCHC: 33.8 g/dL (ref 30.0–36.0)
MCV: 103.6 fl — AB (ref 78.0–100.0)
Monocytes Absolute: 0.7 10*3/uL (ref 0.1–1.0)
Monocytes Relative: 7.9 % (ref 3.0–12.0)
NEUTROS PCT: 63.7 % (ref 43.0–77.0)
Neutro Abs: 5.5 10*3/uL (ref 1.4–7.7)
Platelets: 248 10*3/uL (ref 150.0–400.0)
RBC: 3.93 Mil/uL (ref 3.87–5.11)
RDW: 12.4 % (ref 11.5–15.5)
WBC: 8.6 10*3/uL (ref 4.0–10.5)

## 2017-03-31 LAB — T4, FREE: Free T4: 0.65 ng/dL (ref 0.60–1.60)

## 2017-03-31 LAB — TSH: TSH: 9.52 u[IU]/mL — ABNORMAL HIGH (ref 0.35–4.50)

## 2017-03-31 NOTE — Assessment & Plan Note (Signed)
Occasional anxiety Uses the alprazolam sparingly

## 2017-03-31 NOTE — Progress Notes (Signed)
Subjective:    Patient ID: Daisy Long, female    DOB: 1941/01/17, 76 y.o.   MRN: [id number]  HPI Here for Medicare wellness visit and follow up of chronic health conditions Reviewed form and advanced directives  Reviewed other doctors Does smoke occasionally--discussed giving that up 1- 2 drinks per day--wine Tries to walk most days No falls Vision is okay. Hearing is fine also Independent with instrumental ADLs No apparent memory problems  She is doing well Continues on the BP med Takes it 4-5 days per week--- doesn't like pills so will skip it sometimes No chest pain or SOB No dizziness or syncope No headaches No edema  Energy levels good No sig change in skin, hair or nails Weight down 5# from her exercise  Will have occasional stress Uses the alprazolam sparingly No depression or anhedonia  Current Outpatient Prescriptions on File Prior to Visit  Medication Sig Dispense Refill  . ALPRAZolam (XANAX) 0.25 MG tablet TAKE ONE TABLET BY MOUTH TWICE DAILY AS NEEDED 20 tablet 0  . lisinopril (PRINIVIL,ZESTRIL) 20 MG tablet Take 1 tablet (20 mg total) by mouth daily. 90 tablet 3   No current facility-administered medications on file prior to visit.     Allergies  Allergen Reactions  . Levothyroxine Other (See Comments)    Increased blood pressure  . Triamterene-Hctz     Past Medical History:  Diagnosis Date  . Anxiety   . Hyperlipidemia   . Hypertension   . Thyroid disease     Past Surgical History:  Procedure Laterality Date  . TUBAL LIGATION    . VAGINAL DELIVERY     X3    Family History  Problem Relation Age of Onset  . Coronary artery disease Mother   . Coronary artery disease Daughter   . Breast cancer Maternal Aunt   . Breast cancer Maternal Grandmother     Social History   Social History  . Marital status: Widowed    Spouse name: N/A  . Number of children: 3  . Years of education: N/A   Occupational History  . retired as Statistician    Social History Main Topics  . Smoking status: Current Some Day Smoker    Types: Cigarettes  . Smokeless tobacco: Never Used  . Alcohol use Yes  . Drug use: No  . Sexual activity: Not on file   Other Topics Concern  . Not on file   Social History Narrative   Has living will.    Daughter Mackey Birchwood has health care POA.   Would accept resuscitation but would not want prolonged artificial ventilation   Would not want feeding tube if cognitively unaware                  Review of Systems Appetite is good Sleeps well Wears seat belt Needs dental work-- needs dentist (will go to daughter's dentist) Bowels are fine. No blood Voids fine. No incontinence No rash or suspicious skin lesions Has low back pain-- better with rest. Discussed standing straight and gentle stretching    Objective:   Physical Exam  Constitutional: She is oriented to person, place, and time. She appears well-nourished. No distress.  HENT:  Mouth/Throat: Oropharynx is clear and moist. No oropharyngeal exudate.  Neck: No thyromegaly present.  Cardiovascular: Normal rate, regular rhythm, normal heart sounds and intact distal pulses.  Exam reveals no gallop.   No murmur heard. Pulmonary/Chest: Effort normal and breath sounds normal. No respiratory distress. She  has no wheezes. She has no rales.  Abdominal: Soft. There is no tenderness.  Musculoskeletal: She exhibits no edema or tenderness.  Lymphadenopathy:    She has no cervical adenopathy.  Neurological: She is alert and oriented to person, place, and time.  President--- "Garnet Koyanagi, someone I didn't like" [phone] [id number] Recall 1/3  Skin: No rash noted. No erythema.  Psychiatric: She has a normal mood and affect. Her behavior is normal.          Assessment & Plan:

## 2017-03-31 NOTE — Assessment & Plan Note (Signed)
Not too high She doesn't want meds regardless

## 2017-03-31 NOTE — Assessment & Plan Note (Signed)
No symptoms Even if TSH is over 10, would not treat unless free T4 is low

## 2017-03-31 NOTE — Assessment & Plan Note (Signed)
I have personally reviewed the Medicare Annual Wellness questionnaire and have noted 1. The patient's medical and social history 2. Their use of alcohol, tobacco or illicit drugs 3. Their current medications and supplements 4. The patient's functional ability including ADL's, fall risks, home safety risks and hearing or visual             impairment. 5. Diet and physical activities 6. Evidence for depression or mood disorders  The patients weight, height, BMI and visual acuity have been recorded in the chart I have made referrals, counseling and provided education to the patient based review of the above and I have provided the pt with a written personalized care plan for preventive services.  I have provided you with a copy of your personalized plan for preventive services. Please take the time to review along with your updated medication list.  Prefers no vaccines or cancer screening Discussed stopping even the few cigarettes Working on [employer] a few pounds

## 2017-03-31 NOTE — Assessment & Plan Note (Signed)
BP Readings from Last 3 Encounters:  03/31/17 132/84  07/02/16 (!) 152/90  03/30/16 (!) 160/90   Inconsistent with med but urged compliance Is better though

## 2017-03-31 NOTE — Assessment & Plan Note (Signed)
See social history 

## 2017-03-31 NOTE — Progress Notes (Signed)
Pre visit review using our clinic review tool, if applicable. No additional management support is needed unless otherwise documented below in the visit note. 

## 2017-04-05 ENCOUNTER — Other Ambulatory Visit: Payer: Self-pay | Admitting: Internal Medicine

## 2017-04-05 NOTE — Telephone Encounter (Signed)
Last Rx 01/28/17. Last OV 03/31/17

## 2017-04-06 NOTE — Telephone Encounter (Signed)
Approved: #60 x 0 

## 2017-04-06 NOTE — Telephone Encounter (Signed)
Rx called in to requested pharmacy 

## 2017-06-19 ENCOUNTER — Other Ambulatory Visit: Payer: Self-pay | Admitting: Internal Medicine

## 2017-06-21 NOTE — Telephone Encounter (Signed)
plz phone in. 

## 2017-06-21 NOTE — Telephone Encounter (Signed)
Left refill on voice mail at pharmacy  

## 2017-06-21 NOTE — Telephone Encounter (Signed)
Last filled 04-06-17 #60 Last OV/CPE 03-31-17 No Future OV  Forwarding to Dr Reece AgarG in Dr Karle StarchLetvak's absence. Please send back to me when approved/denied. Thanks

## 2017-07-16 ENCOUNTER — Inpatient Hospital Stay: Payer: Medicare Other

## 2017-07-16 ENCOUNTER — Encounter: Payer: Self-pay | Admitting: Emergency Medicine

## 2017-07-16 ENCOUNTER — Inpatient Hospital Stay
Admission: EM | Admit: 2017-07-16 | Discharge: 2017-07-17 | DRG: 917 | Discharge status: Against Medical Advice | Payer: Medicare Other | Attending: Internal Medicine | Admitting: Internal Medicine

## 2017-07-16 DIAGNOSIS — R809 Proteinuria, unspecified: Secondary | ICD-10-CM | POA: Diagnosis present

## 2017-07-16 DIAGNOSIS — R338 Other retention of urine: Secondary | ICD-10-CM

## 2017-07-16 DIAGNOSIS — Z803 Family history of malignant neoplasm of breast: Secondary | ICD-10-CM

## 2017-07-16 DIAGNOSIS — D72829 Elevated white blood cell count, unspecified: Secondary | ICD-10-CM

## 2017-07-16 DIAGNOSIS — E871 Hypo-osmolality and hyponatremia: Secondary | ICD-10-CM

## 2017-07-16 DIAGNOSIS — I10 Essential (primary) hypertension: Secondary | ICD-10-CM | POA: Diagnosis present

## 2017-07-16 DIAGNOSIS — N8110 Cystocele, unspecified: Secondary | ICD-10-CM | POA: Diagnosis present

## 2017-07-16 DIAGNOSIS — R339 Retention of urine, unspecified: Secondary | ICD-10-CM | POA: Diagnosis present

## 2017-07-16 DIAGNOSIS — R410 Disorientation, unspecified: Secondary | ICD-10-CM | POA: Diagnosis not present

## 2017-07-16 DIAGNOSIS — F17210 Nicotine dependence, cigarettes, uncomplicated: Secondary | ICD-10-CM | POA: Diagnosis present

## 2017-07-16 DIAGNOSIS — Z8249 Family history of ischemic heart disease and other diseases of the circulatory system: Secondary | ICD-10-CM | POA: Diagnosis not present

## 2017-07-16 DIAGNOSIS — E785 Hyperlipidemia, unspecified: Secondary | ICD-10-CM | POA: Diagnosis present

## 2017-07-16 DIAGNOSIS — E039 Hypothyroidism, unspecified: Secondary | ICD-10-CM | POA: Diagnosis present

## 2017-07-16 DIAGNOSIS — T50901A Poisoning by unspecified drugs, medicaments and biological substances, accidental (unintentional), initial encounter: Secondary | ICD-10-CM

## 2017-07-16 DIAGNOSIS — N170 Acute kidney failure with tubular necrosis: Secondary | ICD-10-CM | POA: Diagnosis present

## 2017-07-16 DIAGNOSIS — F419 Anxiety disorder, unspecified: Secondary | ICD-10-CM | POA: Diagnosis present

## 2017-07-16 DIAGNOSIS — N179 Acute kidney failure, unspecified: Secondary | ICD-10-CM | POA: Diagnosis present

## 2017-07-16 DIAGNOSIS — R319 Hematuria, unspecified: Secondary | ICD-10-CM | POA: Diagnosis present

## 2017-07-16 DIAGNOSIS — T375X1A Poisoning by antiviral drugs, accidental (unintentional), initial encounter: Principal | ICD-10-CM | POA: Diagnosis present

## 2017-07-16 LAB — COMPREHENSIVE METABOLIC PANEL
ALT: 27 U/L (ref 14–54)
ANION GAP: 15 (ref 5–15)
AST: 30 U/L (ref 15–41)
Albumin: 4 g/dL (ref 3.5–5.0)
Alkaline Phosphatase: 62 U/L (ref 38–126)
BUN: 41 mg/dL — ABNORMAL HIGH (ref 6–20)
CHLORIDE: 91 mmol/L — AB (ref 101–111)
CO2: 18 mmol/L — ABNORMAL LOW (ref 22–32)
Calcium: 8.4 mg/dL — ABNORMAL LOW (ref 8.9–10.3)
Creatinine, Ser: 3.75 mg/dL — ABNORMAL HIGH (ref 0.44–1.00)
GFR, EST AFRICAN AMERICAN: 13 mL/min — AB (ref >60)
GFR, EST NON AFRICAN AMERICAN: 11 mL/min — AB (ref >60)
Glucose, Bld: 127 mg/dL — ABNORMAL HIGH (ref 65–99)
POTASSIUM: 4.2 mmol/L (ref 3.5–5.1)
Sodium: 124 mmol/L — ABNORMAL LOW (ref 135–145)
Total Bilirubin: 0.7 mg/dL (ref 0.3–1.2)
Total Protein: 7.1 g/dL (ref 6.5–8.1)

## 2017-07-16 LAB — URINALYSIS, COMPLETE (UACMP) WITH MICROSCOPIC
BILIRUBIN URINE: NEGATIVE
Glucose, UA: NEGATIVE mg/dL
Ketones, ur: NEGATIVE mg/dL
LEUKOCYTES UA: NEGATIVE
NITRITE: NEGATIVE
PH: 5 (ref 5.0–8.0)
Protein, ur: 30 mg/dL — AB
SPECIFIC GRAVITY, URINE: 1.005 (ref 1.005–1.030)

## 2017-07-16 LAB — CBC WITH DIFFERENTIAL/PLATELET
Basophils Absolute: 0 10*3/uL (ref 0–0.1)
Basophils Relative: 0 %
EOS ABS: 0 10*3/uL (ref 0–0.7)
Eosinophils Relative: 0 %
HCT: 39.4 % (ref 35.0–47.0)
HEMOGLOBIN: 13.7 g/dL (ref 12.0–16.0)
LYMPHS ABS: 0.8 10*3/uL — AB (ref 1.0–3.6)
LYMPHS PCT: 5 %
MCH: 35.7 pg — AB (ref 26.0–34.0)
MCHC: 34.8 g/dL (ref 32.0–36.0)
MCV: 102.6 fL — AB (ref 80.0–100.0)
Monocytes Absolute: 1.1 10*3/uL — ABNORMAL HIGH (ref 0.2–0.9)
Monocytes Relative: 6 %
NEUTROS ABS: 15.2 10*3/uL — AB (ref 1.4–6.5)
NEUTROS PCT: 89 %
Platelets: 174 10*3/uL (ref 150–440)
RBC: 3.84 MIL/uL (ref 3.80–5.20)
RDW: 11.8 % (ref 11.5–14.5)
WBC: 17.1 10*3/uL — AB (ref 3.6–11.0)

## 2017-07-16 LAB — TROPONIN I: Troponin I: <0.03 ng/mL (ref <0.03)

## 2017-07-16 MED ORDER — ALPRAZOLAM 0.25 MG PO TABS: 0.2500 mg | ORAL_TABLET | Freq: Two times a day (BID) | ORAL | Status: DC | PRN

## 2017-07-16 MED ORDER — SALINE SPRAY 0.65 % NA SOLN
1.0000 | Freq: Once | NASAL | Status: DC
  Filled 2017-07-16: qty 44

## 2017-07-16 MED ORDER — ENOXAPARIN SODIUM 30 MG/0.3ML ~~LOC~~ SOLN: 30.0000 mg | SUBCUTANEOUS | Status: DC

## 2017-07-16 MED ORDER — ONDANSETRON HCL 4 MG/2ML IJ SOLN: 4.0000 mg | Freq: Four times a day (QID) | INTRAMUSCULAR | Status: DC | PRN

## 2017-07-16 MED ORDER — ACETAMINOPHEN 650 MG RE SUPP: 650.0000 mg | Freq: Four times a day (QID) | RECTAL | Status: DC | PRN

## 2017-07-16 MED ORDER — ACETAMINOPHEN 325 MG PO TABS: 650.0000 mg | ORAL_TABLET | Freq: Four times a day (QID) | ORAL | Status: DC | PRN

## 2017-07-16 MED ORDER — HEPARIN SODIUM (PORCINE) 5000 UNIT/ML IJ SOLN
5000.0000 [IU] | Freq: Three times a day (TID) | INTRAMUSCULAR | Status: DC
  Administered 2017-07-17 (×2): 5000 [IU] via SUBCUTANEOUS
  Filled 2017-07-16 (×3): qty 1

## 2017-07-16 MED ORDER — AMLODIPINE BESYLATE 5 MG PO TABS
5.0000 mg | ORAL_TABLET | Freq: Two times a day (BID) | ORAL | Status: DC
  Administered 2017-07-17: 5 mg via ORAL
  Filled 2017-07-16 (×2): qty 1

## 2017-07-16 MED ORDER — ONDANSETRON HCL 4 MG PO TABS: 4.0000 mg | ORAL_TABLET | Freq: Four times a day (QID) | ORAL | Status: DC | PRN

## 2017-07-16 MED ORDER — AMLODIPINE BESYLATE 5 MG PO TABS: 5.0000 mg | ORAL_TABLET | Freq: Two times a day (BID) | ORAL | Status: DC

## 2017-07-16 MED ORDER — SODIUM CHLORIDE 0.9 % IV SOLN
INTRAVENOUS | Status: DC
  Administered 2017-07-16 – 2017-07-17 (×3): via INTRAVENOUS

## 2017-07-16 MED ORDER — DOCUSATE SODIUM 100 MG PO CAPS
100.0000 mg | ORAL_CAPSULE | Freq: Two times a day (BID) | ORAL | Status: DC
  Administered 2017-07-17: 100 mg via ORAL
  Filled 2017-07-16 (×2): qty 1

## 2017-07-16 NOTE — ED Provider Notes (Signed)
[facility] Emergency Department Provider Note   ____________________________________________   First MD Initiated Contact with Patient 07/16/17 1102     (approximate)  I have reviewed the triage vital signs and the nursing notes.   HISTORY  Chief Complaint Medication Reaction and Herpes Zoster    HPI Daisy Long is a 76 y.o. female patient brought in by daughter secondary to accidental overdose of Valtrex. Patient is on antiviral medication for shingles. Instead of taking 1 g 3 times a day patient took all 3 tablets this morning along with prednisone.Patient now appears anxious.   Past Medical History:  Diagnosis Date  . Anxiety   . Hyperlipidemia   . Hypertension   . Thyroid disease     Patient Active Problem List   Diagnosis Date Noted  . Advance directive discussed with patient 03/29/2015  . Hyperlipidemia   . Bladder prolapse, female, acquired 08/01/2013  . Routine general medical examination at a health care facility 12/20/2012  . Subclinical hypothyroidism 09/01/2010  . Episodic mood disorder (HCC) 05/01/2010  . Essential hypertension, benign 05/01/2010    Past Surgical History:  Procedure Laterality Date  . TUBAL LIGATION    . VAGINAL DELIVERY     X3    Prior to Admission medications   Medication Sig Start Date End Date Taking? Authorizing Provider  ALPRAZolam Prudy Feeler(XANAX) 0.25 MG tablet TAKE 1 TABLET BY MOUTH TWICE DAILY AS NEEDED 06/21/17   Eustaquio BoydenGutierrez, Javier, MD  lisinopril (PRINIVIL,ZESTRIL) 20 MG tablet Take 1 tablet (20 mg total) by mouth daily. 07/02/16   Karie SchwalbeLetvak, Richard I, MD    Allergies Levothyroxine and Triamterene-hctz  Family History  Problem Relation Age of Onset  . Coronary artery disease Mother   . Coronary artery disease Daughter   . Breast cancer Maternal Aunt   . Breast cancer Maternal Grandmother     Social History Social History  Substance Use Topics  . Smoking status: Current Some Day Smoker   Types: Cigarettes  . Smokeless tobacco: Never Used  . Alcohol use Yes    Review of Systems  Constitutional: No fever/chills.  Eyes: No visual changes. ENT: No sore throat. Cardiovascular: Denies chest pain. Respiratory: Denies shortness of breath. Gastrointestinal: No abdominal pain.  No nausea, no vomiting.  No diarrhea.  No constipation. Genitourinary: Negative for dysuria. Musculoskeletal: Negative for back pain. Skin: Negative for rash. Neurological: Negative for headaches, focal weakness or numbness. Mild tremors Psychiatric: Anxiety Endocrine:Hypertension, hypothyroidism and hyperlipidemia Hematological/Lymphatic: Allergic/Immunilogical: See medication list ____________________________________________   PHYSICAL EXAM:  VITAL SIGNS: ED Triage Vitals  Enc Vitals Group     BP 07/16/17 1059 (!) 172/80     Pulse Rate 07/16/17 1059 66     Resp 07/16/17 1059 20     Temp 07/16/17 1059 98 F (36.7 C)     Temp Source 07/16/17 1059 Oral     SpO2 07/16/17 1059 99 %     Weight 07/16/17 1100 140 lb (63.5 kg)     Height 07/16/17 1100 4\' 11"  (1.499 m)     Head Circumference --      Peak Flow --      Pain Score --      Pain Loc --      Pain Edu? --      Excl. in GC? --     Constitutional: Alert and Mildly disoriented. Well appearing and in no acute distress.Anxious Hematological/Lymphatic/Immunilogical: No cervical lymphadenopathy. Cardiovascular: Normal rate, regular rhythm. Grossly normal heart sounds.  Good  peripheral circulation. Respiratory: Normal respiratory effort.  No retractions. Lungs CTAB. Musculoskeletal: No lower extremity tenderness nor edema.  No joint effusions. Neurologic:  Normal speech and language. No gross focal neurologic deficits are appreciated. No gait instability. Skin:  Skin is warm, dry and intact. No rash noted. Psychiatric: Mood and affect are normal. Speech and behavior are normal.  ____________________________________________    LABS (all labs ordered are listed, but only abnormal results are displayed)  Labs Reviewed  COMPREHENSIVE METABOLIC PANEL - Abnormal; Notable for the following:       Result Value   Sodium 124 (*)    Chloride 91 (*)    CO2 18 (*)    Glucose, Bld 127 (*)    BUN 41 (*)    Creatinine, Ser 3.75 (*)    Calcium 8.4 (*)    GFR calc non Af Amer 11 (*)    GFR calc Af Amer 13 (*)    All other components within normal limits  CBC WITH DIFFERENTIAL/PLATELET - Abnormal; Notable for the following:    WBC 17.1 (*)    MCV 102.6 (*)    MCH 35.7 (*)    Neutro Abs 15.2 (*)    Lymphs Abs 0.8 (*)    Monocytes Absolute 1.1 (*)    All other components within normal limits   ____________________________________________  EKG   ____________________________________________  RADIOLOGY  No results found.  ____________________________________________   PROCEDURES  Procedure(s) performed: None  Procedures  Critical Care performed: No  ____________________________________________   INITIAL IMPRESSION / ASSESSMENT AND PLAN / ED COURSE  Pertinent labs & imaging results that were available during my care of the patient were reviewed by me and considered in my medical decision making (see chart for details).  Overdose of Valtrex. Patient's labs show that she is in renal failure. Discussed lab results with patient and rationale for admission for definitive evaluation and treatment.Discussed with hospitalist who will come see the patient for admission.      ____________________________________________   FINAL CLINICAL IMPRESSION(S) / ED DIAGNOSES  Final diagnoses:  Acute renal failure with tubular necrosis (HCC)      NEW MEDICATIONS STARTED DURING THIS VISIT:  New Prescriptions   No medications on file     Note:  This document was prepared using Dragon voice recognition software and may include unintentional dictation errors.    Joni ReiningSmith, Ahron Hulbert K, PA-C 07/16/17 1217     Joni ReiningSmith, Maila Dukes K, PA-C 07/16/17 1224    Merrily Brittleifenbark, Neil, MD 07/16/17 2142

## 2017-07-16 NOTE — ED Notes (Signed)
Pt transported to room 228. One bag of belongings transported with pt.

## 2017-07-16 NOTE — Progress Notes (Signed)
Anticoagulation monitoring(Lovenox):  76 yo female ordered Lovenox 30 mg Q24h  Filed Weights   07/16/17 1100  Weight: 140 lb (63.5 kg)   BMI    Lab Results  Component Value Date   CREATININE 3.75 (H) 07/16/2017   CREATININE 0.59 03/31/2017   CREATININE 0.64 07/02/2016   Estimated Creatinine Clearance: 10.3 mL/min (A) (by C-G formula based on SCr of 3.75 mg/dL (H)). Hemoglobin & Hematocrit     Component Value Date/Time   HGB 13.7 07/16/2017 1113   HGB 14.7 03/01/2014 1303   HCT 39.4 07/16/2017 1113   HCT 43.2 03/01/2014 1303     Per Protocol for Patient with estCrcl < 15 ml/min and BMI < 40, will transition to Heparin 5000 units SQ Q8h.

## 2017-07-16 NOTE — ED Notes (Signed)
See triage note   States she took all 3 of her valtrex this am and her prednisone this am  Feels shaky

## 2017-07-16 NOTE — H&P (Signed)
[facility] [place] at [facility]   PATIENT NAME: Daisy Long    MR#:  [id number]  DATE OF BIRTH:  1941-12-19  DATE OF ADMISSION:  07/16/2017  PRIMARY CARE PHYSICIAN: Karie Schwalbe, MD   REQUESTING/REFERRING PHYSICIAN:   CHIEF COMPLAINT:   Chief Complaint  Patient presents with  . Medication Reaction  . Herpes Zoster    HISTORY OF PRESENT ILLNESS: Daisy Long  is a 76 y.o. female with a known history of Low back pain, Essential hypertension, hyperlipidemia, hypothyroidism, bladder prolapse, who presents to the hospital with complaints of feeling dizzy, lightheaded and shaky for the past 1-2 days. She also noted that she cannot void today at all except of if she repositions her bladder. She had difficulty voiding yesterday as well, it was slow flow, but she was able to push the bladder in and avoid. Her urine looked normal, nonbloody with no sediment, according to the patient. She denied any fevers or chills, but felt presyncopal. On arrival to the hospital, her labs revealed acute renal failure. 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 Her labs revealed acute renal failure, hospitalist services were contacted for admission.    PAST MEDICAL HISTORY:   Past Medical History:  Diagnosis Date  . Anxiety   . Hyperlipidemia   . Hypertension   . Thyroid disease     PAST SURGICAL HISTORY: Past Surgical History:  Procedure Laterality Date  . TUBAL LIGATION    . VAGINAL DELIVERY     X3    SOCIAL HISTORY:  Social History  Substance Use Topics  . Smoking status: Current Some Day Smoker    Types: Cigarettes  . Smokeless tobacco: Never Used  . Alcohol use Yes    FAMILY HISTORY:  Family History  Problem Relation Age of Onset  . Coronary artery disease Mother   . Coronary artery disease  Daughter   . Breast cancer Maternal Aunt   . Breast cancer Maternal Grandmother     DRUG ALLERGIES:  Allergies  Allergen Reactions  . Levothyroxine Other (See Comments)    Increased blood pressure  . Triamterene-Hctz     Review of Systems  Constitutional: Negative for chills, fever and weight loss.  HENT: Negative for congestion.   Eyes: Negative for blurred vision and double vision.  Respiratory: Positive for cough. Negative for sputum production, shortness of breath and wheezing.   Cardiovascular: Positive for chest pain. Negative for palpitations, orthopnea, leg swelling and PND.  Gastrointestinal: Negative for abdominal pain, blood in stool, constipation, diarrhea, nausea and vomiting.  Genitourinary: Negative for dysuria, frequency, hematuria and urgency.  Musculoskeletal: Negative for falls.  Neurological: Positive for dizziness, tremors, loss of consciousness and weakness. Negative for focal weakness and headaches.  Endo/Heme/Allergies: Does not bruise/bleed easily.  Psychiatric/Behavioral: Negative for depression. The patient does not have insomnia.     MEDICATIONS AT HOME:  Prior to Admission medications   Medication Sig Start Date End Date Taking? Authorizing Provider  ALPRAZolam Prudy Feeler) 0.25 MG tablet TAKE 1 TABLET BY MOUTH TWICE DAILY AS NEEDED 06/21/17  Yes Eustaquio Boyden, MD  lisinopril (PRINIVIL,ZESTRIL) 20 MG tablet Take 1 tablet (20 mg total) by mouth daily. 07/02/16  Yes Karie Schwalbe, MD  predniSONE (DELTASONE) 20 MG tablet Take 20-40 mg by mouth See admin instructions. Take 40 mg by mouth once daily on  days 1-3, then 20 mg by mouth once daily on days 4-6. 07/14/17  Yes [provider]  valACYclovir (VALTREX) 1000 MG tablet Take 1,000 mg by mouth 3 (three) times daily. 07/14/17 07/24/17 Yes [provider]      PHYSICAL EXAMINATION:   VITAL SIGNS: Blood pressure (!) 172/80, pulse 66, temperature 98 F (36.7 C), temperature source Oral,  resp. rate 20, height 4\' 11"  (1.499 m), weight 63.5 kg (140 lb), SpO2 99 %.  GENERAL:  76 y.o.-year-old patient lying in the bed with no acute distress.  EYES: Pupils equal, round, reactive to light and accommodation. No scleral icterus. Extraocular muscles intact.  HEENT: Head atraumatic, normocephalic. Oropharynx and nasopharynx clear.  NECK:  Supple, no jugular venous distention. No thyroid enlargement, no tenderness.  LUNGS: Normal breath sounds bilaterally, no wheezing, rales,rhonchi or crepitation. No use of accessory muscles of respiration.  CARDIOVASCULAR: S1, S2 normal. No murmurs, rubs, or gallops.  ABDOMEN: Soft, nontender, nondistended. Bowel sounds present. No organomegaly or mass.  EXTREMITIES: No pedal edema, cyanosis, or clubbing.  NEUROLOGIC: Cranial nerves II through XII are intact. Muscle strength 5/5 in all extremities. Sensation intact. Gait not checked.  PSYCHIATRIC: The patient is alert and oriented x 3.  SKIN: No obvious rash, lesion, or ulcer.   LABORATORY PANEL:   CBC  Recent Labs Lab 07/16/17 1113  WBC 17.1*  HGB 13.7  HCT 39.4  PLT 174  MCV 102.6*  MCH 35.7*  MCHC 34.8  RDW 11.8  LYMPHSABS 0.8*  MONOABS 1.1*  EOSABS 0.0  BASOSABS 0.0   ------------------------------------------------------------------------------------------------------------------  Chemistries   Recent Labs Lab 07/16/17 1113  NA 124*  K 4.2  CL 91*  CO2 18*  GLUCOSE 127*  BUN 41*  CREATININE 3.75*  CALCIUM 8.4*  AST 30  ALT 27  ALKPHOS 62  BILITOT 0.7   ------------------------------------------------------------------------------------------------------------------  Cardiac Enzymes No results for input(s): TROPONINI in the last 168 hours. ------------------------------------------------------------------------------------------------------------------  RADIOLOGY: No results found.  EKG: Orders placed or performed in visit on 03/01/14  . EKG 12-Lead     IMPRESSION AND PLAN:  Active Problems:   Bladder prolapse, female, acquired   Renal failure, acute (HCC)   Leukocytosis   Hyponatremia   Acute urinary retention   Acute renal failure (ARF) (HCC)   Accidental medication overdose, initial encounter #1. Acute renal failure, , multifactorial, due to prolapsed bladder, causing obstructive symptoms, ACE inhibitor, dehydration, valacyclovir accidental overdose, initiate patient on IV fluids, get nephrologist involved for further recommendations, stop ACE inhibitor, they'll acyclovir, follow creatinine the morning, Foley catheter was just placed, urinalysis, culture is pending #2. Acute urinary retention, status post Foley catheter placement, patient would need to follow-up with urologist for likely pessary initiation #3. Leukocytosis, get urinalysis, initiate antibiotic therapy, if concerning for joint infection, follow white blood cell count in the morning #4 accidental medication/valacyclovir overdose causing acute renal failure, stop medications, follow labs closely, follow clinically for shingles extension, stop steroids #5. Hyponatremia, follow-up with IV fluid administration #6. Malignant essential hypertension, stop ace inhibitor, initiate patient on Norvasc, advance medications as needed   All the records are reviewed and case discussed with ED provider. Management plans discussed with the patient, family and they are in agreement.  CODE STATUS: Code Status History    This patient does not have a recorded code status. Please follow your organizational policy for patients in this situation.       TOTAL TIME TAKING CARE OF THIS PATIENT: 55 minutes.  Katharina CaperVAICKUTE,Lena Fieldhouse M.D on 07/16/2017 at 1:08 PM  Between 7am to 6pm - Pager - [phone] After 6pm go to [email or web address] - password [email or web address] [facility]  [facility]  Office  [phone]  CC: Primary care physician; Karie SchwalbeLetvak, Richard I, MD

## 2017-07-16 NOTE — ED Notes (Signed)
Foley cath inserted, Urine sample collected and sent to lab.

## 2017-07-16 NOTE — Consult Note (Signed)
[facility]  CONSULT NOTE    Date: 07/16/2017                  Patient Name:  Daisy Long  MRN: [id number]  DOB: [birth year]  Age / Sex: 76 y.o., female         PCP: Karie SchwalbeLetvak, Richard I, MD                 Service Requesting Consult: Dr. Winona LegatoVaickute                 Reason for Consult: Acute renal failure, hyponatremia            History of Present Illness: Ms. Daisy MimesJanice M Long is a 76 y.o. white female with anxiety, hypertension, hyperlipidemia, hypothyroidism, shingles, who was [facility] 07[facility]16/2017 for Acute renal failure with tubular necrosis (HCC) [N17.0]   Patient took valacyclovir 3000mg  at one time and started to feel jittery. She also states she is suffering from bladder prolapse. She states she has been drinking plenty of fluids but is not urinating much.    Medications: Outpatient medications: Prescriptions Prior to Admission  Medication Sig Dispense Refill Last Dose  . ALPRAZolam (XANAX) 0.25 MG tablet TAKE 1 TABLET BY MOUTH TWICE DAILY AS NEEDED 60 tablet 0 PRN at PRN  . lisinopril (PRINIVIL,ZESTRIL) 20 MG tablet Take 1 tablet (20 mg total) by mouth daily. 90 tablet 3 07/16/2017 at 0800  . predniSONE (DELTASONE) 20 MG tablet Take 20-40 mg by mouth See admin instructions. Take 40 mg by mouth once daily on days 1-3, then 20 mg by mouth once daily on days 4-6.   07/16/2017 at 0800  . valACYclovir (VALTREX) 1000 MG tablet Take 1,000 mg by mouth 3 (three) times daily.   07/16/2017 at 0800    Current medications: Current Facility-Administered Medications  Medication Dose Route Frequency Provider Last Rate Last Dose  . 0.9 %  sodium chloride infusion   Intravenous Continuous Katharina CaperVaickute, Rima, MD 100 mL/hr at 07/16/17 1400    . acetaminophen (TYLENOL) tablet 650 mg  650 mg Oral Q6H PRN Katharina CaperVaickute, Rima, MD       Or  . acetaminophen (TYLENOL) suppository 650 mg  650 mg Rectal Q6H PRN Katharina CaperVaickute, Rima, MD      . ALPRAZolam Prudy Feeler(XANAX) tablet 0.25 mg  0.25 mg Oral  BID PRN Katharina CaperVaickute, Rima, MD      . amLODipine (NORVASC) tablet 5 mg  5 mg Oral BID Katharina CaperVaickute, Rima, MD      . docusate sodium (COLACE) capsule 100 mg  100 mg Oral BID Katharina CaperVaickute, Rima, MD      . heparin injection 5,000 Units  5,000 Units Subcutaneous Q8H Vaickute, Rima, MD      . ondansetron (ZOFRAN) tablet 4 mg  4 mg Oral Q6H PRN Katharina CaperVaickute, Rima, MD       Or  . ondansetron (ZOFRAN) injection 4 mg  4 mg Intravenous Q6H PRN Katharina CaperVaickute, Rima, MD          Allergies: Allergies  Allergen Reactions  . Levothyroxine Other (See Comments)    Increased blood pressure  . Triamterene-Hctz       Past Medical History: Past Medical History:  Diagnosis Date  . Anxiety   . Hyperlipidemia   . Hypertension   . Thyroid disease      Past Surgical History: Past Surgical History:  Procedure Laterality Date  . TUBAL LIGATION    . VAGINAL DELIVERY  X3     Family History: Family History  Problem Relation Age of Onset  . Coronary artery disease Mother   . Coronary artery disease Daughter   . Breast cancer Maternal Aunt   . Breast cancer Maternal Grandmother      Social History: Social History   Social History  . Marital status: Widowed    Spouse name: N/A  . Number of children: 3  . Years of education: N/A   Occupational History  . retired as IT consultant    Social History Main Topics  . Smoking status: Current Some Day Smoker    Types: Cigarettes  . Smokeless tobacco: Never Used  . Alcohol use Yes  . Drug use: No  . Sexual activity: Not on file   Other Topics Concern  . Not on file   Social History Narrative   Has living will.    Daughter Mackey Birchwood has health care POA.   Would accept resuscitation but would not want prolonged artificial ventilation   Would not want feeding tube if cognitively unaware                    Review of Systems: Review of Systems  Constitutional: Negative.  Negative for chills, diaphoresis, fever, malaise/fatigue and weight loss.   HENT: Negative.  Negative for congestion, ear discharge, ear pain, hearing loss, nosebleeds, sinus pain, sore throat and tinnitus.   Eyes: Negative.  Negative for blurred vision, double vision, photophobia, pain, discharge and redness.  Respiratory: Negative.  Negative for cough, hemoptysis, sputum production, shortness of breath, wheezing and stridor.   Cardiovascular: Negative.  Negative for chest pain, palpitations, orthopnea, claudication, leg swelling and PND.  Gastrointestinal: Negative.  Negative for abdominal pain, blood in stool, constipation, diarrhea, heartburn, melena, nausea and vomiting.  Genitourinary: Positive for dysuria, frequency and urgency. Negative for flank pain and hematuria.  Musculoskeletal: Negative.  Negative for back pain, falls, joint pain, myalgias and neck pain.  Skin: Negative.  Negative for itching and rash.  Neurological: Positive for tremors. Negative for dizziness, tingling, sensory change, speech change, focal weakness, seizures, loss of consciousness, weakness and headaches.  Endo/Heme/Allergies: Negative for environmental allergies and polydipsia. Does not bruise/bleed easily.  Psychiatric/Behavioral: Negative.  Negative for depression, hallucinations, memory loss, substance abuse and suicidal ideas. The patient is not nervous/anxious and does not have insomnia.     Vital Signs: Blood pressure (!) 147/72, pulse 67, temperature 97.6 F (36.4 C), temperature source Oral, resp. rate 13, height 4\' 11"  (1.499 m), weight 63.5 kg (140 lb), SpO2 97 %.  Weight trends: Filed Weights   07/16/17 1100  Weight: 63.5 kg (140 lb)    Physical Exam: General: NAD, laying in bed  Head: Normocephalic, atraumatic. Moist oral mucosal membranes  Eyes: Red left junctiva  Neck: Supple, trachea midline  Lungs:  Clear to auscultation  Heart: Regular rate and rhythm  Abdomen:  Soft, nontender,   Extremities:  no peripheral edema.  Neurologic: Facial herpes zoster right  temporal distribution  Skin: No lesions        Lab results: Basic Metabolic Panel:  Recent Labs Lab 07/16/17 1113  NA 124*  K 4.2  CL 91*  CO2 18*  GLUCOSE 127*  BUN 41*  CREATININE 3.75*  CALCIUM 8.4*    Liver Function Tests:  Recent Labs Lab 07/16/17 1113  AST 30  ALT 27  ALKPHOS 62  BILITOT 0.7  PROT 7.1  ALBUMIN 4.0   No results for input(s): LIPASE, AMYLASE  in the last 168 hours. No results for input(s): AMMONIA in the last 168 hours.  CBC:  Recent Labs Lab 07/16/17 1113  WBC 17.1*  NEUTROABS 15.2*  HGB 13.7  HCT 39.4  MCV 102.6*  PLT 174    Cardiac Enzymes: No results for input(s): CKTOTAL, CKMB, CKMBINDEX, TROPONINI in the last 168 hours.  BNP: Invalid input(s): POCBNP  CBG: No results for input(s): GLUCAP in the last 168 hours.  Microbiology: No results found for this or any previous visit.  Coagulation Studies: No results for input(s): LABPROT, INR in the last 72 hours.  Urinalysis:  Recent Labs  07/16/17 1248  COLORURINE YELLOW*  LABSPEC 1.005  PHURINE 5.0  GLUCOSEU NEGATIVE  HGBUR SMALL*  BILIRUBINUR NEGATIVE  KETONESUR NEGATIVE  PROTEINUR 30*  NITRITE NEGATIVE  LEUKOCYTESUR NEGATIVE      Imaging:  No results found.   Assessment & Plan: Ms. Daisy MimesJanice M Behrmann is a 76 y.o. white female with anxiety, hypertension, hyperlipidemia, hypothyroidism, shingles, who was [facility] on 07/16/2017 for Acute renal failure with tubular necrosis (HCC) [N17.0]   1. Acute renal failure: 2. Hyponatremia:  3. Hematuria 4. Proteinuria  Concerning for prerenal azotemia versus obstruction from bladder prolapse. Baseline creatinine 0.59 on 03/31/2017.  - Continue IV fluids - Check renal ultrasound     LOS: 0 Tyreon Frigon 7/27/20184:37 PM

## 2017-07-16 NOTE — ED Notes (Signed)
Hospitalist to bedside at this time 

## 2017-07-16 NOTE — ED Notes (Signed)
Resting at present    Awaiting room assignment

## 2017-07-16 NOTE — ED Notes (Signed)
Admitting MD in with pt and family

## 2017-07-16 NOTE — ED Triage Notes (Signed)
Patient to ER for "concerns of overdose". Patient is on anti-viral for shingles diagnosis, took all three of today's doses at one time instead of morning, noon and night.

## 2017-07-17 LAB — BASIC METABOLIC PANEL
ANION GAP: 12 (ref 5–15)
BUN: 47 mg/dL — ABNORMAL HIGH (ref 6–20)
CALCIUM: 7.9 mg/dL — AB (ref 8.9–10.3)
CO2: 20 mmol/L — ABNORMAL LOW (ref 22–32)
Chloride: 95 mmol/L — ABNORMAL LOW (ref 101–111)
Creatinine, Ser: 4.72 mg/dL — ABNORMAL HIGH (ref 0.44–1.00)
GFR, EST AFRICAN AMERICAN: 9 mL/min — AB (ref >60)
GFR, EST NON AFRICAN AMERICAN: 8 mL/min — AB (ref >60)
GLUCOSE: 93 mg/dL (ref 65–99)
POTASSIUM: 4.5 mmol/L (ref 3.5–5.1)
Sodium: 127 mmol/L — ABNORMAL LOW (ref 135–145)

## 2017-07-17 LAB — URINE CULTURE: Culture: NO GROWTH

## 2017-07-17 LAB — CBC
HEMATOCRIT: 37.3 % (ref 35.0–47.0)
HEMOGLOBIN: 13.4 g/dL (ref 12.0–16.0)
MCH: 37.1 pg — ABNORMAL HIGH (ref 26.0–34.0)
MCHC: 35.9 g/dL (ref 32.0–36.0)
MCV: 103.3 fL — ABNORMAL HIGH (ref 80.0–100.0)
Platelets: 173 10*3/uL (ref 150–440)
RBC: 3.61 MIL/uL — AB (ref 3.80–5.20)
RDW: 11.8 % (ref 11.5–14.5)
WBC: 12.2 10*3/uL — ABNORMAL HIGH (ref 3.6–11.0)

## 2017-07-17 LAB — TROPONIN I

## 2017-07-17 LAB — GLUCOSE, CAPILLARY: GLUCOSE-CAPILLARY: 85 mg/dL (ref 65–99)

## 2017-07-17 MED ORDER — LORAZEPAM 2 MG/ML IJ SOLN: 0.5000 mg | Freq: Four times a day (QID) | INTRAMUSCULAR | Status: DC | PRN

## 2017-07-17 NOTE — Progress Notes (Addendum)
[facility] - [place] at [facility]   PATIENT NAME: Daisy MeierJanice Long    MR#:  [id number]  DATE OF BIRTH:  July 27, 1941  SUBJECTIVE:   Patient accidentally took 3000 mg of valacyclovir at one time. She was found to have renal failure. She is suffering from bladder prolapse She was agitated this am wants to go home  A bit confused  REVIEW OF SYSTEMS:    Review of Systems  Constitutional: Negative for fever, chills weight loss HENT: Negative for ear pain, nosebleeds, congestion, facial swelling, rhinorrhea, neck pain, neck stiffness and ear discharge.   Respiratory: Negative for cough, shortness of breath, wheezing  Cardiovascular: Negative for chest pain, palpitations and leg swelling.  Gastrointestinal: Negative for heartburn, abdominal pain, vomiting, diarrhea or consitpation Genitourinary:Positive for bladder prolapse and patient output Musculoskeletal: Negative for back pain or joint pain Neurological: Negative for dizziness, seizures, syncope, focal weakness,  numbness and headaches.  Hematological: Does not bruise/bleed easily.  Psychiatric/Behavioral: Negative for hallucinations, confusion, dysphoric mood    Tolerating Diet: yes      DRUG ALLERGIES:   Allergies  Allergen Reactions  . Levothyroxine Other (See Comments)    Increased blood pressure  . Triamterene-Hctz     VITALS:  Blood pressure 132/60, pulse 68, temperature 97.8 F (36.6 C), temperature source Oral, resp. rate 16, height 4\' 11"  (1.499 m), weight 67.6 kg (149 lb 1.6 oz), SpO2 98 %.  PHYSICAL EXAMINATION:  Constitutional: Appears well-developed and well-nourished. No distress. HENT: Normocephalic. Marland Kitchen. Oropharynx is clear and moist.  Eyes: Conjunctivae and EOM are normal. PERRLA, no scleral icterus.  Neck: Normal ROM. Neck supple. No JVD. No tracheal deviation. CVS: RRR, S1/S2 +, no murmurs, no gallops, no carotid bruit.  Pulmonary: Effort and breath sounds normal, no stridor, rhonchi,  wheezes, rales.  Abdominal: Soft. BS +,  no distension, tenderness, rebound or guarding.  Musculoskeletal: Normal range of motion. No edema and no tenderness.  Neuro: Alert. CN 2-12 grossly intact. No focal deficits. Skin: Skin is warm and dry. No rash noted. Psychiatric: Normal mood and affect.      LABORATORY PANEL:   CBC  Recent Labs Lab 07/17/17 0612  WBC 12.2*  HGB 13.4  HCT 37.3  PLT 173   ------------------------------------------------------------------------------------------------------------------  Chemistries   Recent Labs Lab 07/16/17 1113 07/17/17 0612  NA 124* 127*  K 4.2 4.5  CL 91* 95*  CO2 18* 20*  GLUCOSE 127* 93  BUN 41* 47*  CREATININE 3.75* 4.72*  CALCIUM 8.4* 7.9*  AST 30  --   ALT 27  --   ALKPHOS 62  --   BILITOT 0.7  --    ------------------------------------------------------------------------------------------------------------------  Cardiac Enzymes  Recent Labs Lab 07/16/17 1813 07/17/17 0029 07/17/17 0612  TROPONINI <0.03 <0.03 <0.03   ------------------------------------------------------------------------------------------------------------------  RADIOLOGY:  [place] Renal  Result Date: 07/16/2017 CLINICAL DATA:  Acute kidney injury EXAM: RENAL / URINARY TRACT ULTRASOUND COMPLETE COMPARISON:  None. FINDINGS: Right Kidney: Length: 11.6 cm. Echogenicity within normal limits. No mass or hydronephrosis visualized. Left Kidney: Length: 11.4 cm. Echogenicity within normal limits. No mass or hydronephrosis visualized. Bladder: Decompressed by Foley catheter. IMPRESSION: Normal renal ultrasound. Electronically Signed   By: Deatra RobinsonKevin  Herman M.D.   On: 07/16/2017 17:11     ASSESSMENT AND PLAN:   76 year old female with history of bladder prolapse, shingles currently on Valtrex who presented with accidental overdose and found to have acute kidney injury.  1. Acute kidney injury with HTN: Renal ultrasound shows no evidence of  hydronephrosis Hold nephrotoxic medications Continue IV fluids Repeat BMP in a.m. Suspect the creatinine will peak and then  2. Essential hypertension: ACE inhibitor discontinued due to problem #1 Continue Norvasc   3. Anxiety: Continue Xanax when necessary  4. Hyponatremia: Sodium level improving with IV fluids  5. Recent diagnosis shingles: Continue precautions Consider restarting Valtrex tomorrow  Management plans discussed with the patient and she is in agreement.  CODE STATUS: full  TOTAL TIME TAKING CARE OF THIS PATIENT: 30 minutes.     POSSIBLE D/C 2 days, DEPENDING ON CLINICAL CONDITION.   Jullian Clayson M.D on 07/17/2017 at 10:16 AM  Between 7am to 6pm - Pager - [phone] After 6pm go to [email or web address] - password [email or web address]  [facility] [place] [facility]  Office  [phone]  CC: Primary care physician; Karie SchwalbeLetvak, Richard I, MD  Note: This dictation was prepared with Dragon dictation along with smaller phrase technology. Any transcriptional errors that result from this process are unintentional.

## 2017-07-17 NOTE — Progress Notes (Signed)
RN notified Dr Juliene PinaMody that per patient's daughter, patient takes 0.25mg  xanax twice a day prn for anxiety.   MD ordered 0.5mg  ativan IV q6hr PRN.

## 2017-07-17 NOTE — Progress Notes (Signed)
RN notified Dr Juliene PinaMody and Dr Ronn MelenaKolloru of patient's being confused and agitated, wanting to pull out IV and Foley, patient refused all meds this am.  Patient also states want to go home and thinks that we are holding for no reason as she is feeling well.   RN gave phone for Dr Juliene PinaMody to speak to daughter.  Dr Juliene PinaMody gave order for safety sitter PRN for now and continuous later if necessary.  Daughter currently at bedside.  Dr Wynelle LinkKolluru states if necessary can remove foley and use hat to monitor I &O but try to keep foley in if anyway possible.   RN will attempt to place IV fluids back on.

## 2017-07-17 NOTE — Progress Notes (Signed)
Patient told Dr Wynelle LinkKolluru that she will remove her foley cath herself, Dr gave order to remove foley.   Hat place in bedside commode to monitor urine output.  Daughter instructed to not empty without notifying nursing staff. Patient appearing more calm and pleasant but continues to refused meds and ted hose, MD aware. Sitter at bedside at this time.

## 2017-07-17 NOTE — Progress Notes (Signed)
[facility]  ROUNDING NOTE   Subjective:   NS at 100  Creatinine 4.72 (3.75)  Na 127 (124)  Objective:  Vital signs in last 24 hours:  Temp:  [97.6 F (36.4 C)-98.6 F (37 C)] 97.8 F (36.6 C) (07/28 0506) Pulse Rate:  [63-72] 68 (07/28 0506) Resp:  [13-16] 16 (07/28 0506) BP: (132-147)/(59-77) 132/60 (07/28 0506) SpO2:  [96 %-98 %] 98 % (07/28 0506) Weight:  [67.6 kg (149 lb 1.6 oz)] 67.6 kg (149 lb 1.6 oz) (07/28 0506)  Weight change:  Filed Weights   07/16/17 1100 07/17/17 0506  Weight: 63.5 kg (140 lb) 67.6 kg (149 lb 1.6 oz)    Intake/Output: I/O last 3 completed shifts: In: 1840 [P.O.:240; I.V.:1600] Out: 700 [Urine:700]   Intake/Output this shift:  No intake/output data recorded.  Physical Exam: General: NAD,   Head: Normocephalic, atraumatic. Moist oral mucosal membranes  Eyes: Anicteric, PERRL  Neck: Supple, trachea midline  Lungs:  Clear to auscultation  Heart: Regular rate and rhythm  Abdomen:  Soft, nontender,   Extremities: no peripheral edema.  Neurologic: Nonfocal, moving all four extremities  Skin: No lesions  GU: Foley with yellow urine    Basic Metabolic Panel:  Recent Labs Lab 07/16/17 1113 07/17/17 0612  NA 124* 127*  K 4.2 4.5  CL 91* 95*  CO2 18* 20*  GLUCOSE 127* 93  BUN 41* 47*  CREATININE 3.75* 4.72*  CALCIUM 8.4* 7.9*    Liver Function Tests:  Recent Labs Lab 07/16/17 1113  AST 30  ALT 27  ALKPHOS 62  BILITOT 0.7  PROT 7.1  ALBUMIN 4.0   No results for input(s): LIPASE, AMYLASE in the last 168 hours. No results for input(s): AMMONIA in the last 168 hours.  CBC:  Recent Labs Lab 07/16/17 1113 07/17/17 0612  WBC 17.1* 12.2*  NEUTROABS 15.2*  --   HGB 13.7 13.4  HCT 39.4 37.3  MCV 102.6* 103.3*  PLT 174 173    Cardiac Enzymes:  Recent Labs Lab 07/16/17 1813 07/17/17 0029 07/17/17 0612  TROPONINI <0.03 <0.03 <0.03    BNP: Invalid input(s): POCBNP  CBG:  Recent Labs Lab  07/17/17 0810  GLUCAP 85    Microbiology: No results found for this or any previous visit.  Coagulation Studies: No results for input(s): LABPROT, INR in the last 72 hours.  Urinalysis:  Recent Labs  07/16/17 1248  COLORURINE YELLOW*  LABSPEC 1.005  PHURINE 5.0  GLUCOSEU NEGATIVE  HGBUR SMALL*  BILIRUBINUR NEGATIVE  KETONESUR NEGATIVE  PROTEINUR 30*  NITRITE NEGATIVE  LEUKOCYTESUR NEGATIVE      Imaging: [place] Renal  Result Date: 07/16/2017 CLINICAL DATA:  Acute kidney injury EXAM: RENAL / URINARY TRACT ULTRASOUND COMPLETE COMPARISON:  None. FINDINGS: Right Kidney: Length: 11.6 cm. Echogenicity within normal limits. No mass or hydronephrosis visualized. Left Kidney: Length: 11.4 cm. Echogenicity within normal limits. No mass or hydronephrosis visualized. Bladder: Decompressed by Foley catheter. IMPRESSION: Normal renal ultrasound. Electronically Signed   By: Deatra RobinsonKevin  Herman M.D.   On: 07/16/2017 17:11     Medications:   . sodium chloride 100 mL/hr at 07/17/17 1047   . amLODipine  5 mg Oral BID  . docusate sodium  100 mg Oral BID  . heparin subcutaneous  5,000 Units Subcutaneous Q8H   acetaminophen **OR** acetaminophen, ALPRAZolam, ondansetron **OR** ondansetron (ZOFRAN) IV  Assessment/ Plan:  Ms. Consuela MimesJanice M Cino is a 76 y.o. white female Ms. Consuela MimesJanice M Rieth is a 76 y.o. white female with  anxiety, hypertension, hyperlipidemia, hypothyroidism, shingles, who [facility] for Acute renal failure with tubular necrosis (HCC) [N17.0]   1. Acute renal failure: 2. Hyponatremia:  3. Hematuria 4. Proteinuria  Concerning for prerenal azotemia. Renal ultrasound reviewed. No obstructive uropathy  Baseline creatinine 0.59 on 03/31/2017.  - Continue IV fluids   LOS: 1 Aadya Kindler 7/28/201811:08 AM

## 2017-07-17 NOTE — Progress Notes (Signed)
Went into Pt room to stop her IV beep and I mentioned to her that I will need to bring her another bad of fluids, Pt noted that she had had enough and did not want any more. Pt  Started to disconnect the line from her IV. I tried to stop her but she got belligerent. She then proceeded to pull the arm brace that wrapped her IV off noting that she was not staying here any longer and did not need it. I tole her that I would call her daughter so she could talk with her. I called the daughter and Pt convinced her that she should come and get her. The daughter told me that she was coming to get her and she did. I notified the MD, nursing supervisor and charge nurse of the situation. I took the IV out as PT continued to pull at it.  Pt daughter came and I advised her that she needed to make sure that the Pt was staying hydrated and that she was voiding adequately. I also advised her to take her to her PCP ASAP.Pt daughter signed the AMA papers and they left the room.

## 2017-07-18 NOTE — Discharge Summary (Signed)
76 year old female with history of bladder prolapse, shingles currently on Valtrex who presented with accidental overdose and found to have acute kidney injury on admission. Despite speaking with the patient and daughter about increasing creatinine and her confusion patient left AMA    1. Acute kidney injury with HTN: Renal ultrasound shows no evidence of hydronephrosis Her creatinine was increasing. She was evaluated by Nephrology.  2. Essential hypertension: ACE inhibitor wasdiscontinued due to problem #1 Continue Norvasc   3. Anxiety: Continue Xanax when necessary  4. Hyponatremia: Sodium level had somewhat improved with IVF.  5. Recent diagnosis shingles:Valtrex was discontinued   Patient left AMA She was admitted 7/27 and left AMA on 7/28  CC: Primary care physician; Karie SchwalbeLetvak, Richard I, MD

## 2017-07-19 ENCOUNTER — Telehealth: Payer: Self-pay

## 2017-07-19 NOTE — Telephone Encounter (Signed)
Per Dr Alphonsus SiasLetvak: Please contact her ASAP  She needs an emergent follow up and blood work!!   Spoke to pt. She said she is not feeling bad. She is not having issue urinating. She has no back pain and declines to make an office visit right now to "allow the medical system to open a long road of unnecessary labs and visits" I explained that she did not have to feel bad or have issues voiding to have major issues with her kidneys. She still declined the OV. Dr Alphonsus SiasLetvak is aware.

## 2017-08-06 ENCOUNTER — Other Ambulatory Visit: Payer: Self-pay | Admitting: Internal Medicine

## 2017-08-06 NOTE — Telephone Encounter (Signed)
Refilled once in PCP absence 

## 2017-08-06 NOTE — Telephone Encounter (Signed)
Last filled 04-06-17 #60 Last OV/CPE 03-31-17 No Future OV  Forward to Dr Milinda Antis in Dr Karle Starch absence. Please send back to me when approved/denied, thanks.

## 2017-08-06 NOTE — Telephone Encounter (Signed)
Rx called in as prescribed 

## 2017-08-12 ENCOUNTER — Ambulatory Visit: Payer: Medicare Other | Admitting: Internal Medicine

## 2017-09-30 ENCOUNTER — Other Ambulatory Visit: Payer: Self-pay | Admitting: Internal Medicine

## 2017-11-08 ENCOUNTER — Emergency Department
Admission: EM | Admit: 2017-11-08 | Discharge: 2017-11-08 | Discharge status: Against Medical Advice | Payer: Medicare Other | Attending: Emergency Medicine | Admitting: Emergency Medicine

## 2017-11-08 ENCOUNTER — Emergency Department: Payer: Medicare Other

## 2017-11-08 ENCOUNTER — Encounter: Payer: Self-pay | Admitting: Emergency Medicine

## 2017-11-08 DIAGNOSIS — Z79899 Other long term (current) drug therapy: Secondary | ICD-10-CM | POA: Insufficient documentation

## 2017-11-08 DIAGNOSIS — E038 Other specified hypothyroidism: Secondary | ICD-10-CM | POA: Insufficient documentation

## 2017-11-08 DIAGNOSIS — K56609 Unspecified intestinal obstruction, unspecified as to partial versus complete obstruction: Secondary | ICD-10-CM | POA: Insufficient documentation

## 2017-11-08 DIAGNOSIS — I10 Essential (primary) hypertension: Secondary | ICD-10-CM | POA: Diagnosis not present

## 2017-11-08 DIAGNOSIS — F17210 Nicotine dependence, cigarettes, uncomplicated: Secondary | ICD-10-CM | POA: Diagnosis not present

## 2017-11-08 DIAGNOSIS — R1030 Lower abdominal pain, unspecified: Secondary | ICD-10-CM | POA: Diagnosis not present

## 2017-11-08 LAB — URINALYSIS, COMPLETE (UACMP) WITH MICROSCOPIC
BACTERIA UA: NONE SEEN
BILIRUBIN URINE: NEGATIVE
GLUCOSE, UA: NEGATIVE mg/dL
HGB URINE DIPSTICK: NEGATIVE
Ketones, ur: 20 mg/dL — AB
NITRITE: NEGATIVE
PH: 5 (ref 5.0–8.0)
Protein, ur: 30 mg/dL — AB
SPECIFIC GRAVITY, URINE: 1.023 (ref 1.005–1.030)

## 2017-11-08 LAB — COMPREHENSIVE METABOLIC PANEL
ALBUMIN: 3.7 g/dL (ref 3.5–5.0)
ALT: 67 U/L — AB (ref 14–54)
AST: 83 U/L — AB (ref 15–41)
Alkaline Phosphatase: 82 U/L (ref 38–126)
Anion gap: 13 (ref 5–15)
BUN: 10 mg/dL (ref 6–20)
CHLORIDE: 98 mmol/L — AB (ref 101–111)
CO2: 22 mmol/L (ref 22–32)
Calcium: 8.5 mg/dL — ABNORMAL LOW (ref 8.9–10.3)
Creatinine, Ser: 0.57 mg/dL (ref 0.44–1.00)
GFR calc Af Amer: >60 mL/min (ref >60)
Glucose, Bld: 137 mg/dL — ABNORMAL HIGH (ref 65–99)
POTASSIUM: 4.3 mmol/L (ref 3.5–5.1)
SODIUM: 133 mmol/L — AB (ref 135–145)
TOTAL PROTEIN: 7 g/dL (ref 6.5–8.1)
Total Bilirubin: 1.3 mg/dL — ABNORMAL HIGH (ref 0.3–1.2)

## 2017-11-08 LAB — CBC
HCT: 49.1 % — ABNORMAL HIGH (ref 35.0–47.0)
Hemoglobin: 16.6 g/dL — ABNORMAL HIGH (ref 12.0–16.0)
MCH: 36.8 pg — ABNORMAL HIGH (ref 26.0–34.0)
MCHC: 33.9 g/dL (ref 32.0–36.0)
MCV: 108.6 fL — ABNORMAL HIGH (ref 80.0–100.0)
PLATELETS: 278 10*3/uL (ref 150–440)
RBC: 4.52 MIL/uL (ref 3.80–5.20)
RDW: 12.1 % (ref 11.5–14.5)
WBC: 11.5 10*3/uL — AB (ref 3.6–11.0)

## 2017-11-08 LAB — LIPASE, BLOOD: LIPASE: 23 U/L (ref 11–51)

## 2017-11-08 MED ORDER — IOPAMIDOL (ISOVUE-300) INJECTION 61%
100.0000 mL | Freq: Once | INTRAVENOUS | Status: AC | PRN
  Administered 2017-11-08: 100 mL via INTRAVENOUS

## 2017-11-08 MED ORDER — SODIUM CHLORIDE 0.9 % IV BOLUS (SEPSIS)
1000.0000 mL | Freq: Once | INTRAVENOUS | Status: AC
  Administered 2017-11-08: 1000 mL via INTRAVENOUS

## 2017-11-08 MED ORDER — MORPHINE SULFATE (PF) 2 MG/ML IV SOLN
2.0000 mg | Freq: Once | INTRAVENOUS | Status: AC
  Administered 2017-11-08: 2 mg via INTRAVENOUS
  Filled 2017-11-08: qty 1

## 2017-11-08 MED ORDER — ONDANSETRON HCL 4 MG/2ML IJ SOLN
4.0000 mg | Freq: Once | INTRAMUSCULAR | Status: AC
  Administered 2017-11-08: 4 mg via INTRAVENOUS
  Filled 2017-11-08: qty 2

## 2017-11-08 NOTE — ED Provider Notes (Signed)
[facility] Emergency Department Provider Note  ____________________________________________  Time seen: Approximately 9:27 PM  I have reviewed the triage vital signs and the nursing notes.   HISTORY  Chief Complaint Abdominal Pain    HPI Daisy Long is a 76 y.o. female with a history of bladder prolapse and reports herself is otherwise healthy who complains of gradual onset diffuse lower abdominal pain, continuous, waxing and waning, gradually worsening over the past 2 weeks. No specific aggravating or alleviating factors. Now for the last few days she's had decreased appetite and nausea and vomiting. No fever or chills. Reports some loose bowel movements and denies constipation. Pain feels like cramping. Never had anything like this before.     Past Medical History:  Diagnosis Date  . Anxiety   . Hyperlipidemia   . Hypertension   . Thyroid disease      Patient Active Problem List   Diagnosis Date Noted  . Renal failure, acute ([place]) 07/16/2017  . Leukocytosis 07/16/2017  . Hyponatremia 07/16/2017  . Acute urinary retention 07/16/2017  . Acute renal failure (ARF) ([place]) 07/16/2017  . Accidental medication overdose, initial encounter 07/16/2017  . Advance directive discussed with patient 03/29/2015  . Hyperlipidemia   . Bladder prolapse, female, acquired 08/01/2013  . Routine general medical examination at a health care facility 12/20/2012  . Subclinical hypothyroidism 09/01/2010  . Episodic mood disorder ([place]) 05/01/2010  . Essential hypertension, benign 05/01/2010     Past Surgical History:  Procedure Laterality Date  . TUBAL LIGATION    . VAGINAL DELIVERY     X3     Prior to Admission medications   Medication Sig Start Date End Date Taking? Authorizing Provider  Ascorbic Acid (VITAMIN C) 1000 MG tablet Take 1,000 mg daily by mouth.   Yes [provider]  b complex vitamins tablet Take 1 tablet daily by mouth.   Yes  [provider]  vitamin B-12 (CYANOCOBALAMIN) 100 MCG tablet Take 100 mcg daily by mouth.   Yes [provider]  ALPRAZolam (XANAX) 0.25 MG tablet TAKE 1 TABLET BY MOUTH TWICE DAILY AS NEEDED Patient not taking: Reported on 11/08/2017 08/06/17   Tower, Wynelle Fanny, MD  lisinopril (PRINIVIL,ZESTRIL) 20 MG tablet TAKE ONE TABLET BY MOUTH ONCE DAILY Patient not taking: Reported on 11/08/2017 09/30/17   Venia Carbon, MD  predniSONE (DELTASONE) 20 MG tablet Take 20-40 mg by mouth See admin instructions. Take 40 mg by mouth once daily on days 1-3, then 20 mg by mouth once daily on days 4-6. 07/14/17   [provider]     Allergies Levothyroxine and Triamterene-hctz   Family History  Problem Relation Age of Onset  . Coronary artery disease Mother   . Coronary artery disease Daughter   . Breast cancer Maternal Aunt   . Breast cancer Maternal Grandmother     Social History Social History   Tobacco Use  . Smoking status: Current Some Day Smoker    Types: Cigarettes  . Smokeless tobacco: Never Used  Substance Use Topics  . Alcohol use: Yes  . Drug use: No    Review of Systems  Constitutional:   No fever or chills.  ENT:   No sore throat. No rhinorrhea. Cardiovascular:   No chest pain or syncope. Respiratory:   No dyspnea or cough. Gastrointestinal:   Positive as above for abdominal pain, vomiting and diarrhea.  Musculoskeletal:   Negative for focal pain or swelling All other systems reviewed and are  negative except as documented above in ROS and HPI.  ____________________________________________   PHYSICAL EXAM:  VITAL SIGNS: ED Triage Vitals  Enc Vitals Group     BP 11/08/17 1855 135/81     Pulse Rate 11/08/17 1737 96     Resp 11/08/17 1737 16     Temp 11/08/17 1737 98.3 F (36.8 C)     Temp Source 11/08/17 1737 Oral     SpO2 11/08/17 1737 95 %     Weight 11/08/17 1738 132 lb (59.9 kg)     Height 11/08/17 1738 4' 11" (1.499 m)     Head  Circumference --      Peak Flow --      Pain Score 11/08/17 1737 7     Pain Loc --      Pain Edu? --      Excl. in [place]? --     Vital signs reviewed, nursing assessments reviewed.   Constitutional:   Alert and oriented. Well appearing and in no distress. Eyes:   No scleral icterus.  EOMI. No nystagmus. No conjunctival pallor. PERRL. ENT   Head:   Normocephalic and atraumatic.   Nose:   No congestion/rhinnorhea.    Mouth/Throat:   MMM, no pharyngeal erythema. No peritonsillar mass.    Neck:   No meningismus. Full ROM. Hematological/Lymphatic/Immunilogical:   No cervical lymphadenopathy. Cardiovascular:   RRR. Symmetric bilateral radial and DP pulses.  No murmurs.  Respiratory:   Normal respiratory effort without tachypnea/retractions. Breath sounds are clear and equal bilaterally. No wheezes/rales/rhonchi. Gastrointestinal:   Soft with right lower quadrant tenderness. Non distended. There is no CVA tenderness.  No rebound, rigidity, or guarding. Genitourinary:   deferred Musculoskeletal:   Normal range of motion in all extremities. No joint effusions.  No lower extremity tenderness.  No edema. Neurologic:   Normal speech and language.  Motor grossly intact. No gross focal neurologic deficits are appreciated.  Skin:    Skin is warm, dry and intact. No rash noted.  No petechiae, purpura, or bullae.  ____________________________________________    LABS (pertinent positives/negatives) (all labs ordered are listed, but only abnormal results are displayed) Labs Reviewed  COMPREHENSIVE METABOLIC PANEL - Abnormal; Notable for the following components:      Result Value   Sodium 133 (*)    Chloride 98 (*)    Glucose, Bld 137 (*)    Calcium 8.5 (*)    AST 83 (*)    ALT 67 (*)    Total Bilirubin 1.3 (*)    All other components within normal limits  CBC - Abnormal; Notable for the following components:   WBC 11.5 (*)    Hemoglobin 16.6 (*)    HCT 49.1 (*)    MCV 108.6  (*)    MCH 36.8 (*)    All other components within normal limits  URINALYSIS, COMPLETE (UACMP) WITH MICROSCOPIC - Abnormal; Notable for the following components:   Color, Urine AMBER (*)    APPearance HAZY (*)    Ketones, ur 20 (*)    Protein, ur 30 (*)    Leukocytes, UA MODERATE (*)    Squamous Epithelial / LPF 6-30 (*)    All other components within normal limits  LIPASE, BLOOD   ____________________________________________   EKG    ____________________________________________    RADIOLOGY  Ct Abdomen Pelvis W Contrast  Result Date: 11/08/2017 CLINICAL DATA:  Nausea and vomiting. EXAM: CT ABDOMEN AND PELVIS WITH CONTRAST TECHNIQUE: Multidetector CT imaging of the abdomen  and pelvis was performed using the standard protocol following bolus administration of intravenous contrast. CONTRAST:  18m ISOVUE-300 IOPAMIDOL (ISOVUE-300) INJECTION 61% COMPARISON:  None available FINDINGS: Lower chest: Lung bases are clear. Hepatobiliary: No biliary duct dilatation. Gallbladder normal. No focal hepatic lesion. Portal veins patent. No ascites. Common bile duct normal caliber Pancreas: Pancreas is normal. No ductal dilatation. No pancreatic inflammation. Spleen: Normal spleen Adrenals/urinary tract: Adrenal glands and kidneys are normal. The ureters and bladder normal. Stomach/Bowel: Stomach, duodenum and most proximal small bowel are normal. There is dilatation of the mid small bowel with up to 2.7 cm which is within normal limits. These are fluid-filled. The more distal small bowel as irregular with long segments of luminal narrowing with submucosal enhancement and edema. (image 31, series 7 for example). There is some stasis of enteric contents proximal to this long segment of abnormal enhancement and luminal narrowing. The most distal small bowel is decompressed. The colon is decompressed. Vascular/Lymphatic: Abdominal aorta is normal caliber with atherosclerotic calcification. There is an  useful aneurysm of the celiac trunk to 10 mm (image 64, series 18). SMA is patent. IMA is patent. There is no retroperitoneal or periportal lymphadenopathy. No pelvic lymphadenopathy. Reproductive: Round enhancing lesions associated with the uterus likely represent intramural and exophytic leiomyomas. No adnexal abnormality. Other: No free fluid. Musculoskeletal: No aggressive osseous lesion. IMPRESSION: 1. Long segments of submucosal edema and luminal narrowing within the mid small bowel with proximal early obstruction. Differential would include inflammatory bowel disease, small-bowel hemorrhage, vasculitides, autoimmune disease, and ischemic disease. 2. Distal small bowel and colon is decompressed consists with mechanical obstruction. 3. Celiac trunk and SMA are patent. Fusiform aneurysm of the celiac trunk. 4. Smaller free fluid the pelvis likely related to the bowel pathology. No perforation. Electronically Signed   By: SSuzy BouchardM.D.   On: 11/08/2017 20:01    ____________________________________________   PROCEDURES Procedures  ____________________________________________   DIFFERENTIAL DIAGNOSIS  Appendicitis, diverticulitis, small bowel obstruction, perforation, intra-abdominal abscess.  CLINICAL IMPRESSION / ASSESSMENT AND PLAN / ED COURSE  Pertinent labs & imaging results that were available during my care of the patient were reviewed by me and considered in my medical decision making (see chart for details).   Low suspicion of AAA or dissection. Doubt kidney stone UTI or pyelonephritis. Patient is not septic. With localizing pain and tenderness and GI symptoms, we'll proceed with CT scan, check labs.  Clinical Course as of Nov 08 2126  Mon Nov 08, 2017  2124 Met pt in hallway. States she is leaving.  Discussed lab, CT results, recommended admission. Pt refuses. Will leave AMA. Would not wait for DC instructions or surgery/gi clinic referral info. Recommended close follow  up and immediate return to ED if symptoms worsen or if she wishes to continue treatment for this.   [PS]  2126 Pt has MDM capacity  [PS]    Clinical Course User Index [PS] SCarrie Mew MD     ----------------------------------------- 9:31 PM on 11/08/2017 -----------------------------------------  In the hallway, patient reports that her pain is gone. She has been sipping water without any difficulty. Hemoglobin of 16 suggests hemoconcentration of dehydration.  ____________________________________________   FINAL CLINICAL IMPRESSION(S) / ED DIAGNOSES    Final diagnoses:  Lower abdominal pain  Small bowel obstruction ([place]  Dehydration    This SmartLink is deprecated. Use AVSMEDLIST instead to display the medication list for a patient.   Portions of this note were generated with dragon dictation software. Dictation errors may occur  despite best attempts at proofreading.    Carrie Mew, MD 11/08/17 2132

## 2017-11-08 NOTE — ED Triage Notes (Signed)
Pt in via POV with complaints of worsening abdominal cramping w/ N/V/D x approximately one week.  Vitals WDL, NAD noted at this time.

## 2017-11-08 NOTE — ED Notes (Signed)
Pt alert  States feeling better after meds and iv fluids.  Family with pt.

## 2017-11-08 NOTE — ED Notes (Signed)
Pt states she wants to leave and is aggravated that doctor has not been in room again.  Dr Scotty Courtstafford aware.

## 2017-11-08 NOTE — ED Notes (Signed)
AMA form signed per patient with family present

## 2017-11-08 NOTE — ED Notes (Signed)
ED Provider at bedside. 

## 2017-11-08 NOTE — ED Notes (Signed)
Pt has n/v/d for 1 week.  Pt states I just feel bad and weak.  No back pain.  Denies urinary sx.  Pt alert.  Family with pt.  Iv started and med given.

## 2017-11-08 NOTE — ED Notes (Signed)
Pt took out her own iv.  Pt wants to leave and signed out ama with charge nurse.  md aware.

## 2017-11-08 NOTE — ED Notes (Signed)
Pt still waiting on doctor.  md aware.  Pt aggravated.

## 2017-11-09 ENCOUNTER — Telehealth: Payer: Self-pay

## 2017-11-09 NOTE — Telephone Encounter (Signed)
Spoke to pt to find out why she left the ER last night without treatment. She said she realized she had not been eating well and decided to leave and treat herself. She went and used a colon cleanse at home and cleaned herself out. Said she feels fine now. No pain.

## 2017-11-10 NOTE — Telephone Encounter (Signed)
Okay No need for further action if she feels okay

## 2017-12-31 ENCOUNTER — Encounter: Payer: Self-pay | Admitting: Internal Medicine

## 2017-12-31 ENCOUNTER — Ambulatory Visit (INDEPENDENT_AMBULATORY_CARE_PROVIDER_SITE_OTHER): Payer: Medicare HMO | Admitting: Internal Medicine

## 2017-12-31 VITALS — BP 114/70 | HR 83 | Temp 97.5°F | Wt 129.0 lb

## 2017-12-31 DIAGNOSIS — I728 Aneurysm of other specified arteries: Secondary | ICD-10-CM | POA: Diagnosis not present

## 2017-12-31 DIAGNOSIS — L03316 Cellulitis of umbilicus: Secondary | ICD-10-CM | POA: Diagnosis not present

## 2017-12-31 MED ORDER — KETOCONAZOLE 2 % EX CREA: 1.0000 "application " | TOPICAL_CREAM | Freq: Every day | CUTANEOUS | 1 refills | Status: DC

## 2017-12-31 MED ORDER — FLUCONAZOLE 100 MG PO TABS: 100.0000 mg | ORAL_TABLET | Freq: Every day | ORAL | 1 refills | Status: DC

## 2017-12-31 NOTE — Assessment & Plan Note (Signed)
Incidental finding on CT scan No action for now Small bowel findings could be concerning for chronic process but she denies symptoms now other than decreased appetite May want to consider GI evaluation if more weight loss (she doesn't want this now)

## 2017-12-31 NOTE — Assessment & Plan Note (Signed)
Seems fungal Will try fluconazole and ketoconazole cream Change to antibiotic if it doesn't respond

## 2017-12-31 NOTE — Patient Instructions (Signed)
Let me know if your redness doesn't go away with the treatment.

## 2017-12-31 NOTE — Progress Notes (Signed)
   Subjective:    Patient ID: Daisy Long, female    DOB: 1941/12/15, 77 y.o.   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 ALPRAZolam (XANAX) 0.25 MG tablet TAKE 1 TABLET BY MOUTH TWICE DAILY AS NEEDED 60 tablet 0  . Ascorbic Acid (VITAMIN C) 1000 MG tablet Take 1,000 mg daily by mouth.    Marland Kitchen. b complex vitamins tablet Take 1 tablet daily by mouth.    Marland Kitchen. lisinopril (PRINIVIL,ZESTRIL) 20 MG tablet TAKE ONE TABLET BY MOUTH ONCE DAILY 90 tablet 1  . vitamin B-12 (CYANOCOBALAMIN) 100 MCG tablet Take 100 mcg daily by mouth.     No current facility-administered medications on file prior to visit.     Allergies  Allergen Reactions  . Levothyroxine Other (See Comments)    Increased blood pressure  . Triamterene-Hctz     Past Medical History:  Diagnosis Date  . Anxiety   . Hyperlipidemia   . Hypertension   . Thyroid disease     Past Surgical History:  Procedure Laterality Date  . TUBAL LIGATION    . VAGINAL DELIVERY     X3    Family History  Problem Relation Age of Onset  . Coronary artery disease Mother   . Coronary artery disease Daughter   . Breast cancer Maternal Aunt   . Breast cancer Maternal Grandmother     Social History   Socioeconomic History  . Marital status: Widowed    Spouse name: Not on file  . Number of children: 3  . Years of education: Not on file  . Highest education level: Not on file  Social Needs  . Financial resource strain: Not on file  . Food insecurity - worry: Not on file  . Food insecurity - inability: Not on file  . Transportation needs - medical: Not on file  .  Transportation needs - non-medical: Not on file  Occupational History  . 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 Smoking status: Current Some Day Smoker    Types: Cigarettes  . Smokeless tobacco: Never Used  Substance and Sexual Activity  . Alcohol use: Yes  . Drug use: No  . Sexual activity: Not on file  Other Topics Concern  . Not on file  Social History Narrative   Has living will.    Daughter Mackey Birchwoodaulette has health care POA.   Would accept resuscitation but would not want prolonged artificial ventilation   Would not want feeding tube if cognitively unaware               Review of Systems No fever Appetite is off Weight is down 10# over the past year No blood in stool No FH of colitis or Crohn's    Objective:   Physical Exam  Skin:  Diffuse cool redness in umbilicus and surrounding 3-4cm No discharge Not really inflamed or tender          Assessment & Plan:

## 2018-01-07 DIAGNOSIS — B029 Zoster without complications: Secondary | ICD-10-CM | POA: Diagnosis not present

## 2018-01-10 ENCOUNTER — Encounter: Payer: Self-pay | Admitting: Internal Medicine

## 2018-01-10 ENCOUNTER — Telehealth: Payer: Self-pay

## 2018-01-10 ENCOUNTER — Ambulatory Visit (INDEPENDENT_AMBULATORY_CARE_PROVIDER_SITE_OTHER): Payer: Medicare HMO | Admitting: Internal Medicine

## 2018-01-10 VITALS — BP 122/66 | HR 78 | Temp 98.0°F | Wt 125.0 lb

## 2018-01-10 DIAGNOSIS — L03316 Cellulitis of umbilicus: Secondary | ICD-10-CM | POA: Diagnosis not present

## 2018-01-10 NOTE — Telephone Encounter (Signed)
PLEASE NOTE: All timestamps contained within this report are represented as [place] Standard Time. CONFIDENTIALTY NOTICE: This fax transmission is intended only for the addressee. It contains information that is legally privileged, confidential or otherwise protected from use or disclosure. If you are not the intended recipient, you are strictly prohibited from reviewing, disclosing, copying using or disseminating any of this information or taking any action in reliance on or regarding this information. If you have received this fax in error, please notify us immediately by telephone so that we can arrange for its return to us. Phone: [phone], Toll-Free: [phone], Fax: [phone] Page: 1 of 1 Call Id: [id number] [facility] Client Nonclinical Telephone Record [facility] Client [facility] - [facility] - Night Physician Tillman AbideLetvak, Richard - MD Contact Type Call Who Is Calling Patient / Member / Family / Caregiver Caller Name Roselyn MeierJanice Carton Caller Phone Number [phone] Patient Name Roselyn MeierJanice Cerra Patient DOB [birth year] Call Type Message Only Information Provided Reason for Call Request to Schedule Office Appointment Initial Comment Caller states she needs to make an appointment. Additional Comment Call Closed By: Doristine MangoJennifer Bennett Transaction Date/Time: 01/09/2018 6:13:31 PM (ET)

## 2018-01-10 NOTE — Telephone Encounter (Signed)
PLEASE NOTE: All timestamps contained within this report are represented as [place] Time. CONFIDENTIALTY NOTICE: This fax transmission is intended only for the addressee. It contains information that is legally privileged, confidential or otherwise protected from use or disclosure. If you are not the intended recipient, you are strictly prohibited from reviewing, disclosing, copying using or disseminating any of this information or taking any action in reliance on or regarding this information. If you have received this fax in error, please notify us immediately by telephone so that we can arrange for its return to us. Phone: [phone], Toll-Free: [phone], Fax: [phone] Page: 1 of 2 Call Id: [id number] [facility] Client TELEPHONE ADVICE RECORD [facility] Patient Name: Daisy MeierJANICE Long Gender: Female DOB: 14-May-1941 Age: 77 Y 7 M 9 D D Return Phone Number: [phone] (Primary), [phone] (Secondary) Address: City/State[place]: [place] [address] Client [facility] - Night Physician Tillman AbideLetvak, Richard - MD Contact Type Call Who Is Calling Patient / Member / Family / Caregiver Call Type Triage / Clinical Relationship To Patient Self Return Phone Number [phone] (Primary) Chief Complaint Unclassified Symptom Reason for Call Symptomatic / Request for Health Information Initial Comment Caller states she is needing to get a prescription for redness around her naval. It is acyclovir 800 mg. Translation No Nurse Assessment Nurse: Laural BenesJohnson, RN, Dondra SpryGail Date/Time Lamount Cohen(Eastern Time): 01/09/2018 5:40:26 PM Confirm and document reason for call. If symptomatic, describe symptoms. ---Liborio NixonJanice has redness around bellybutton -- PCP gave the medication she took but did not help redness continued so went to [facility] and they gave up acyclovir 800mg  po 5 times daily;  still have two days left was working and now really red and irritated Does the patient have any new or worsening symptoms? ---Yes Will a triage be completed? ---Yes Related visit to physician within the last 2 weeks? ---Yes Does the PT have any chronic conditions? (i.e. diabetes, asthma, etc.) ---Unknown Is this a behavioral health or substance abuse call? ---No Guidelines Guideline Title Affirmed Question Affirmed Notes Nurse Date/Time (Eastern Time) Rash or Redness - Localized [1] Looks infected (spreading redness, pus) AND [2] no fever Liberty HandyJohnson, RN, Gail 01/09/2018 5:46:04 PM Disp. Time Lamount Cohen(Eastern Time) Disposition Final User 01/09/2018 5:52:57 PM See Physician within 24 Hours Yes Laural BenesJohnson, RN, Suzi RootsGail Caller Disagree/Comply Comply Caller Understands Yes PreDisposition Call Doctor PLEASE NOTE: All timestamps contained within this report are represented as [place] Time. CONFIDENTIALTY NOTICE: This fax transmission is intended only for the addressee. It contains information that is legally privileged, confidential or otherwise protected from use or disclosure. If you are not the intended recipient, you are strictly prohibited from reviewing, disclosing, copying using or disseminating any of this information or taking any action in reliance on or regarding this information. If you have received this fax in error, please notify us immediately by telephone so that we can arrange for its return to us. Phone: [phone], Toll-Free: [phone], Fax: [phone] Page: 2 of 2 Call Id: [id number] Care Advice Given Per Guideline SEE PHYSICIAN WITHIN 24 HOURS: CLEANSING: Wash the infected area with warm water and an antibacterial soap 3 times a day. * Fever occurs * You become worse. CARE ADVICE given per Rash - Localized and Cause Unknown (Adult) guideline. ANTIBIOTIC OINTMENT: Apply antibiotic ointment (OTC) to the infected area 3 times per day. Referrals REFERRED TO PCP OFFICE

## 2018-01-10 NOTE — Patient Instructions (Signed)
Please continue the ketoconazole cream around your navel and cover with a cotton gauze. You don't need the pills (fluconazole) for now--though you can use them if the redness gets bad again.

## 2018-01-10 NOTE — Telephone Encounter (Signed)
I spoke with Daisy Long and she does want an appt today to be seen. Daisy Long scheduled appt 01/10/18 at 2:45. FYI to Dr Alphonsus SiasLetvak.

## 2018-01-10 NOTE — Progress Notes (Signed)
Subjective:    Patient ID: Daisy Long, female    DOB: Aug 07, 1941, 77 y.o.   MRN: [id number]  HPI Here due to ongoing problems around navel  Has been taking the pills and cream but they don't seem to be helping Looks about the same She washes it daily with soap and water No discharge No pain  Current Outpatient Medications on File Prior to Visit  Medication Sig Dispense Refill  . acyclovir (ZOVIRAX) 800 MG tablet     . ALPRAZolam (XANAX) 0.25 MG tablet TAKE 1 TABLET BY MOUTH TWICE DAILY AS NEEDED 60 tablet 0  . Ascorbic Acid (VITAMIN C) 1000 MG tablet Take 1,000 mg daily by mouth.    Marland Kitchen. b complex vitamins tablet Take 1 tablet daily by mouth.    . fluconazole (DIFLUCAN) 100 MG tablet Take 1 tablet (100 mg total) by mouth daily. 7 tablet 1  . ketoconazole (NIZORAL) 2 % cream Apply 1 application topically daily. 60 g 1  . lisinopril (PRINIVIL,ZESTRIL) 20 MG tablet TAKE ONE TABLET BY MOUTH ONCE DAILY 90 tablet 1  . vitamin B-12 (CYANOCOBALAMIN) 100 MCG tablet Take 100 mcg daily by mouth.     No current facility-administered medications on file prior to visit.     Allergies  Allergen Reactions  . Levothyroxine Other (See Comments)    Increased blood pressure  . Triamterene-Hctz     Past Medical History:  Diagnosis Date  . Anxiety   . Hyperlipidemia   . Hypertension   . Thyroid disease     Past Surgical History:  Procedure Laterality Date  . TUBAL LIGATION    . VAGINAL DELIVERY     X3    Family History  Problem Relation Age of Onset  . Coronary artery disease Mother   . Coronary artery disease Daughter   . Breast cancer Maternal Aunt   . Breast cancer Maternal Grandmother     Social History   Socioeconomic History  . Marital status: Widowed    Spouse name: Not on file  . Number of children: 3  . Years of education: Not on file  . Highest education level: Not on file  Social Needs  . Financial resource strain: Not on file  . Food insecurity - worry: Not  on file  . Food insecurity - inability: Not on file  . Transportation needs - medical: Not on file  . Transportation needs - non-medical: Not on file  Occupational History  . 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 Smoking status: Current Some Day Smoker    Types: Cigarettes  . Smokeless tobacco: Never Used  Substance and Sexual Activity  . Alcohol use: Yes  . Drug use: No  . Sexual activity: Not on file  Other Topics Concern  . Not on file  Social History Narrative   Has living will.    Daughter Mackey Birchwoodaulette has health care POA.   Would accept resuscitation but would not want prolonged artificial ventilation   Would not want feeding tube if cognitively unaware               Review of Systems No fever No N/V Appetite is okay Bowels moving fine    Objective:   Physical Exam  Constitutional: No distress.  Abdominal:  Former inflammation around umbilicus is mostly gone Square shaped mild redness looks to be from the bandaid she has been keeping on it Very small isolated areas of inflammation --but not directly at opening No  discharge from umbilicus and swab comes back dry          Assessment & Plan:

## 2018-01-10 NOTE — Assessment & Plan Note (Signed)
Mostly better Reassured that the current redness is probably are reaction to the bandaid Asked her to continue the ketoconazole cream and use a cotton guaze on top No need to continue fluconazole now

## 2018-01-10 NOTE — Telephone Encounter (Signed)
Pt saw Dr Alphonsus SiasLetvak 12/31/17.

## 2018-01-10 NOTE — Telephone Encounter (Signed)
Okay--will assess at her appt.

## 2018-01-10 NOTE — Telephone Encounter (Signed)
PLEASE NOTE: All timestamps contained within this report are represented as [place] Standard Time. CONFIDENTIALTY NOTICE: This fax transmission is intended only for the addressee. It contains information that is legally privileged, confidential or otherwise protected from use or disclosure. If you are not the intended recipient, you are strictly prohibited from reviewing, disclosing, copying using or disseminating any of this information or taking any action in reliance on or regarding this information. If you have received this fax in error, please notify us immediately by telephone so that we can arrange for its return to us. Phone: [phone], Toll-Free: [phone], Fax: [phone] Page: 1 of 1 Call Id: [id number] [facility] Client TELEPHONE ADVICE RECORD [facility] Patient Name: Roselyn MeierJANICE Kinyon Gender: Female DOB: 12/16/1941 Age: [age] Y 7 M 10 D Return Phone Number: [phone] (Primary), [phone] (Secondary) Address: City/State/[place]: [place] [id number] Client [facility] - Night Physician Tillman AbideLetvak, Richard - MD Contact Type Call Who Is Calling Patient / Member / Family / Caregiver Call Type Triage / Clinical Relationship To Patient Self Return Phone Number [phone] (Primary) Chief Complaint Sores Reason for Call Symptomatic / Request for Health Information Initial Comment Caller said she has a redness around her naval, has been a week now. Translation No Nurse Assessment Nurse: Mechele CollinElliott, RN, Sue LushAndrea Date/Time Lamount Cohen(Eastern Time): 01/09/2018 7:32:07 PM Confirm and document reason for call. If symptomatic, describe symptoms. ---Caller states that she called in a couple of hours ago about the redness on her navel, but the MD doesnt see patients on Sunday. Does the patient have any new or worsening symptoms? ---No Please document clinical information  provided and list any resource used. ---Per directives and office hours, explained that the office opens at 7:30 am and she could call then to make an appointment. Caller denies any further triage or worsening symptoms at this time. Guidelines Guideline Title Affirmed Question Affirmed Notes Nurse Date/Time (Eastern Time) Disp. Time Lamount Cohen(Eastern Time) Disposition Final User 01/09/2018 7:33:53 PM Clinical Call Yes Mechele CollinElliott, RN, Sue LushAndrea

## 2018-04-21 ENCOUNTER — Encounter: Payer: Medicare HMO | Admitting: Internal Medicine

## 2018-04-21 DIAGNOSIS — Z0289 Encounter for other administrative examinations: Secondary | ICD-10-CM

## 2018-07-08 ENCOUNTER — Telehealth: Payer: Self-pay | Admitting: Internal Medicine

## 2018-07-08 NOTE — Telephone Encounter (Signed)
Copied from CRM [id number]. Topic: General - Other >> Jul 08, 2018  2:35 PM Marylen PontoMcneil, Ja-Kwan wrote: Reason for CRM: Pt daughter Mackey Birchwoodaulette states she needs to speak with Dr Karle StarchLetvak's nurse to discuss patients well being. Paulette requests a call back. Cb# [phone]

## 2018-07-08 NOTE — Telephone Encounter (Addendum)
Spoke to pt's daughter per DPR. She lives in [place] now. The pt is showing signs of Dementia. She was found on the floor yesterday by the manager. She has lost a lot of weight. She said she will get her Mom to see about scheduling an appointment with Dr Alphonsus SiasLetvak. She says the pt is in denial that she is getting bad off.

## 2018-07-19 ENCOUNTER — Emergency Department: Payer: Medicare PPO

## 2018-07-19 ENCOUNTER — Inpatient Hospital Stay
Admission: EM | Admit: 2018-07-19 | Discharge: 2018-07-25 | DRG: 896 | Disposition: A | Discharge status: Home / Self Care | Payer: Medicare PPO | Attending: Internal Medicine | Admitting: Internal Medicine

## 2018-07-19 ENCOUNTER — Other Ambulatory Visit: Payer: Self-pay

## 2018-07-19 DIAGNOSIS — B961 Klebsiella pneumoniae [K. pneumoniae] as the cause of diseases classified elsewhere: Secondary | ICD-10-CM | POA: Diagnosis present

## 2018-07-19 DIAGNOSIS — K7011 Alcoholic hepatitis with ascites: Secondary | ICD-10-CM | POA: Diagnosis present

## 2018-07-19 DIAGNOSIS — I10 Essential (primary) hypertension: Secondary | ICD-10-CM | POA: Diagnosis present

## 2018-07-19 DIAGNOSIS — F17210 Nicotine dependence, cigarettes, uncomplicated: Secondary | ICD-10-CM | POA: Diagnosis present

## 2018-07-19 DIAGNOSIS — F10121 Alcohol abuse with intoxication delirium: Secondary | ICD-10-CM | POA: Diagnosis present

## 2018-07-19 DIAGNOSIS — Z8249 Family history of ischemic heart disease and other diseases of the circulatory system: Secondary | ICD-10-CM | POA: Diagnosis not present

## 2018-07-19 DIAGNOSIS — F0390 Unspecified dementia without behavioral disturbance: Secondary | ICD-10-CM | POA: Diagnosis present

## 2018-07-19 DIAGNOSIS — Z66 Do not resuscitate: Secondary | ICD-10-CM | POA: Diagnosis present

## 2018-07-19 DIAGNOSIS — R339 Retention of urine, unspecified: Secondary | ICD-10-CM | POA: Diagnosis present

## 2018-07-19 DIAGNOSIS — E785 Hyperlipidemia, unspecified: Secondary | ICD-10-CM | POA: Diagnosis present

## 2018-07-19 DIAGNOSIS — E860 Dehydration: Secondary | ICD-10-CM | POA: Diagnosis present

## 2018-07-19 DIAGNOSIS — E039 Hypothyroidism, unspecified: Secondary | ICD-10-CM | POA: Diagnosis present

## 2018-07-19 DIAGNOSIS — R4182 Altered mental status, unspecified: Secondary | ICD-10-CM

## 2018-07-19 DIAGNOSIS — Z515 Encounter for palliative care: Secondary | ICD-10-CM | POA: Diagnosis present

## 2018-07-19 DIAGNOSIS — R410 Disorientation, unspecified: Secondary | ICD-10-CM

## 2018-07-19 DIAGNOSIS — E43 Unspecified severe protein-calorie malnutrition: Secondary | ICD-10-CM | POA: Diagnosis present

## 2018-07-19 DIAGNOSIS — K7460 Unspecified cirrhosis of liver: Secondary | ICD-10-CM

## 2018-07-19 DIAGNOSIS — F1010 Alcohol abuse, uncomplicated: Secondary | ICD-10-CM

## 2018-07-19 DIAGNOSIS — Z803 Family history of malignant neoplasm of breast: Secondary | ICD-10-CM

## 2018-07-19 DIAGNOSIS — G92 Toxic encephalopathy: Secondary | ICD-10-CM | POA: Diagnosis present

## 2018-07-19 DIAGNOSIS — E876 Hypokalemia: Secondary | ICD-10-CM | POA: Diagnosis present

## 2018-07-19 DIAGNOSIS — Z79899 Other long term (current) drug therapy: Secondary | ICD-10-CM | POA: Diagnosis not present

## 2018-07-19 DIAGNOSIS — G928 Other toxic encephalopathy: Secondary | ICD-10-CM | POA: Diagnosis present

## 2018-07-19 DIAGNOSIS — K7031 Alcoholic cirrhosis of liver with ascites: Secondary | ICD-10-CM | POA: Diagnosis present

## 2018-07-19 DIAGNOSIS — Y904 Blood alcohol level of 80-99 mg/100 ml: Secondary | ICD-10-CM | POA: Diagnosis present

## 2018-07-19 DIAGNOSIS — I712 Thoracic aortic aneurysm, without rupture, unspecified: Secondary | ICD-10-CM

## 2018-07-19 DIAGNOSIS — E872 Acidosis: Secondary | ICD-10-CM | POA: Diagnosis present

## 2018-07-19 DIAGNOSIS — K760 Fatty (change of) liver, not elsewhere classified: Secondary | ICD-10-CM | POA: Diagnosis present

## 2018-07-19 DIAGNOSIS — K7030 Alcoholic cirrhosis of liver without ascites: Secondary | ICD-10-CM | POA: Diagnosis not present

## 2018-07-19 DIAGNOSIS — Z888 Allergy status to other drugs, medicaments and biological substances status: Secondary | ICD-10-CM | POA: Diagnosis not present

## 2018-07-19 DIAGNOSIS — Z7189 Other specified counseling: Secondary | ICD-10-CM | POA: Diagnosis not present

## 2018-07-19 DIAGNOSIS — I513 Intracardiac thrombosis, not elsewhere classified: Secondary | ICD-10-CM | POA: Diagnosis present

## 2018-07-19 DIAGNOSIS — I236 Thrombosis of atrium, auricular appendage, and ventricle as current complications following acute myocardial infarction: Secondary | ICD-10-CM | POA: Diagnosis not present

## 2018-07-19 DIAGNOSIS — Z6820 Body mass index (BMI) 20.0-20.9, adult: Secondary | ICD-10-CM

## 2018-07-19 LAB — URINALYSIS, COMPLETE (UACMP) WITH MICROSCOPIC
BACTERIA UA: NONE SEEN
Glucose, UA: NEGATIVE mg/dL
Hgb urine dipstick: NEGATIVE
KETONES UR: 5 mg/dL — AB
LEUKOCYTES UA: NEGATIVE
NITRITE: NEGATIVE
PROTEIN: NEGATIVE mg/dL
Specific Gravity, Urine: 1.021 (ref 1.005–1.030)
pH: 5 (ref 5.0–8.0)

## 2018-07-19 LAB — URINE DRUG SCREEN, QUALITATIVE (ARMC ONLY)
Amphetamines, Ur Screen: NOT DETECTED
Barbiturates, Ur Screen: NOT DETECTED
Benzodiazepine, Ur Scrn: NOT DETECTED
CANNABINOID 50 NG, UR ~~LOC~~: NOT DETECTED
COCAINE METABOLITE, UR ~~LOC~~: NOT DETECTED
MDMA (ECSTASY) UR SCREEN: NOT DETECTED
Methadone Scn, Ur: NOT DETECTED
OPIATE, UR SCREEN: NOT DETECTED
PHENCYCLIDINE (PCP) UR S: NOT DETECTED
Tricyclic, Ur Screen: NOT DETECTED

## 2018-07-19 LAB — CK: Total CK: 19 U/L — ABNORMAL LOW (ref 38–234)

## 2018-07-19 LAB — CBC WITH DIFFERENTIAL/PLATELET
BASOS ABS: 0 10*3/uL (ref 0–0.1)
BASOS PCT: 0 %
EOS ABS: 0 10*3/uL (ref 0–0.7)
Eosinophils Relative: 0 %
HEMATOCRIT: 35.5 % (ref 35.0–47.0)
HEMOGLOBIN: 12.1 g/dL (ref 12.0–16.0)
Lymphocytes Relative: 6 %
Lymphs Abs: 0.7 10*3/uL — ABNORMAL LOW (ref 1.0–3.6)
MCH: 39 pg — ABNORMAL HIGH (ref 26.0–34.0)
MCHC: 34 g/dL (ref 32.0–36.0)
MCV: 114.8 fL — ABNORMAL HIGH (ref 80.0–100.0)
Monocytes Absolute: 0.4 10*3/uL (ref 0.2–0.9)
Monocytes Relative: 3 %
NEUTROS ABS: 10.7 10*3/uL — AB (ref 1.4–6.5)
NEUTROS PCT: 91 %
Platelets: 195 10*3/uL (ref 150–440)
RBC: 3.1 MIL/uL — ABNORMAL LOW (ref 3.80–5.20)
RDW: 13.2 % (ref 11.5–14.5)
WBC: 11.9 10*3/uL — ABNORMAL HIGH (ref 3.6–11.0)

## 2018-07-19 LAB — COMPREHENSIVE METABOLIC PANEL
ALT: 27 U/L (ref 0–44)
ANION GAP: 17 — AB (ref 5–15)
AST: 114 U/L — ABNORMAL HIGH (ref 15–41)
Albumin: 2.6 g/dL — ABNORMAL LOW (ref 3.5–5.0)
Alkaline Phosphatase: 152 U/L — ABNORMAL HIGH (ref 38–126)
BILIRUBIN TOTAL: 2.9 mg/dL — AB (ref 0.3–1.2)
BUN: 10 mg/dL (ref 8–23)
CALCIUM: 8.1 mg/dL — AB (ref 8.9–10.3)
CO2: 23 mmol/L (ref 22–32)
CREATININE: 0.67 mg/dL (ref 0.44–1.00)
Chloride: 101 mmol/L (ref 98–111)
Glucose, Bld: 102 mg/dL — ABNORMAL HIGH (ref 70–99)
Potassium: 4.1 mmol/L (ref 3.5–5.1)
Sodium: 141 mmol/L (ref 135–145)
TOTAL PROTEIN: 6.6 g/dL (ref 6.5–8.1)

## 2018-07-19 LAB — TSH: TSH: 9.054 u[IU]/mL — ABNORMAL HIGH (ref 0.350–4.500)

## 2018-07-19 LAB — LACTIC ACID, PLASMA
LACTIC ACID, VENOUS: 4 mmol/L — AB (ref 0.5–1.9)
LACTIC ACID, VENOUS: 3.7 mmol/L — AB (ref 0.5–1.9)
Lactic Acid, Venous: 1.3 mmol/L (ref 0.5–1.9)

## 2018-07-19 LAB — ACETAMINOPHEN LEVEL: Acetaminophen (Tylenol), Serum: <10 ug/mL — ABNORMAL LOW (ref 10–30)

## 2018-07-19 LAB — ETHANOL: Alcohol, Ethyl (B): 86 mg/dL — ABNORMAL HIGH (ref <10)

## 2018-07-19 LAB — TROPONIN I: Troponin I: <0.03 ng/mL (ref <0.03)

## 2018-07-19 LAB — BRAIN NATRIURETIC PEPTIDE: B Natriuretic Peptide: 134 pg/mL — ABNORMAL HIGH (ref 0.0–100.0)

## 2018-07-19 LAB — SALICYLATE LEVEL: Salicylate Lvl: <7 mg/dL (ref 2.8–30.0)

## 2018-07-19 LAB — AMMONIA: AMMONIA: 20 umol/L (ref 9–35)

## 2018-07-19 MED ORDER — LORAZEPAM 1 MG PO TABS: 1.0000 mg | ORAL_TABLET | Freq: Four times a day (QID) | ORAL | Status: AC | PRN

## 2018-07-19 MED ORDER — ALBUTEROL SULFATE (2.5 MG/3ML) 0.083% IN NEBU: 2.5000 mg | INHALATION_SOLUTION | RESPIRATORY_TRACT | Status: DC | PRN

## 2018-07-19 MED ORDER — FOLIC ACID 1 MG PO TABS
1.0000 mg | ORAL_TABLET | Freq: Every day | ORAL | Status: DC
  Administered 2018-07-20 – 2018-07-23 (×2): 1 mg via ORAL
  Filled 2018-07-19 (×4): qty 1

## 2018-07-19 MED ORDER — ENOXAPARIN SODIUM 40 MG/0.4ML ~~LOC~~ SOLN
40.0000 mg | SUBCUTANEOUS | Status: DC
  Administered 2018-07-19 – 2018-07-24 (×5): 40 mg via SUBCUTANEOUS
  Filled 2018-07-19 (×5): qty 0.4

## 2018-07-19 MED ORDER — LORAZEPAM 2 MG/ML IJ SOLN
1.0000 mg | Freq: Four times a day (QID) | INTRAMUSCULAR | Status: AC | PRN
  Administered 2018-07-21 – 2018-07-22 (×2): 1 mg via INTRAVENOUS
  Filled 2018-07-19 (×2): qty 1

## 2018-07-19 MED ORDER — IOPAMIDOL (ISOVUE-370) INJECTION 76%
75.0000 mL | Freq: Once | INTRAVENOUS | Status: AC | PRN
  Administered 2018-07-19: 75 mL via INTRAVENOUS

## 2018-07-19 MED ORDER — LISINOPRIL 20 MG PO TABS
20.0000 mg | ORAL_TABLET | Freq: Every day | ORAL | Status: DC
  Administered 2018-07-20 – 2018-07-23 (×2): 20 mg via ORAL
  Filled 2018-07-19 (×3): qty 1

## 2018-07-19 MED ORDER — ADULT MULTIVITAMIN W/MINERALS CH
1.0000 | ORAL_TABLET | Freq: Every day | ORAL | Status: DC
  Administered 2018-07-20 – 2018-07-23 (×2): 1 via ORAL
  Filled 2018-07-19 (×4): qty 1

## 2018-07-19 MED ORDER — THIAMINE HCL 100 MG/ML IJ SOLN
INTRAVENOUS | Status: AC
  Administered 2018-07-19 – 2018-07-20 (×2): via INTRAVENOUS
  Filled 2018-07-19 (×3): qty 1000

## 2018-07-19 MED ORDER — LEVOTHYROXINE SODIUM 25 MCG PO TABS
25.0000 ug | ORAL_TABLET | Freq: Every day | ORAL | Status: DC
  Administered 2018-07-20: 25 ug via ORAL
  Filled 2018-07-19 (×4): qty 1

## 2018-07-19 MED ORDER — VITAMIN B-1 100 MG PO TABS
100.0000 mg | ORAL_TABLET | Freq: Every day | ORAL | Status: DC
  Administered 2018-07-20 – 2018-07-23 (×2): 100 mg via ORAL
  Filled 2018-07-19 (×4): qty 1

## 2018-07-19 MED ORDER — ASPIRIN EC 81 MG PO TBEC
81.0000 mg | DELAYED_RELEASE_TABLET | Freq: Every day | ORAL | Status: DC
  Administered 2018-07-20 – 2018-07-23 (×2): 81 mg via ORAL
  Filled 2018-07-19 (×4): qty 1

## 2018-07-19 MED ORDER — THIAMINE HCL 100 MG/ML IJ SOLN
100.0000 mg | Freq: Once | INTRAMUSCULAR | Status: AC
  Administered 2018-07-19: 100 mg via INTRAVENOUS
  Filled 2018-07-19: qty 2

## 2018-07-19 MED ORDER — ONDANSETRON HCL 4 MG PO TABS
4.0000 mg | ORAL_TABLET | Freq: Four times a day (QID) | ORAL | Status: DC | PRN
Start: 2018-07-19 — End: 2018-07-25

## 2018-07-19 MED ORDER — SODIUM CHLORIDE 0.9 % IV BOLUS
500.0000 mL | Freq: Once | INTRAVENOUS | Status: AC
  Administered 2018-07-19: 500 mL via INTRAVENOUS

## 2018-07-19 MED ORDER — THIAMINE HCL 100 MG/ML IJ SOLN
100.0000 mg | Freq: Every day | INTRAMUSCULAR | Status: DC
  Administered 2018-07-22 – 2018-07-25 (×3): 100 mg via INTRAVENOUS
  Filled 2018-07-19 (×6): qty 1

## 2018-07-19 MED ORDER — ONDANSETRON HCL 4 MG/2ML IJ SOLN: 4.0000 mg | Freq: Four times a day (QID) | INTRAMUSCULAR | Status: DC | PRN

## 2018-07-19 MED ORDER — POLYETHYLENE GLYCOL 3350 17 G PO PACK: 17.0000 g | PACK | Freq: Every day | ORAL | Status: DC | PRN

## 2018-07-19 MED ORDER — HYDROCODONE-ACETAMINOPHEN 5-325 MG PO TABS
1.0000 | ORAL_TABLET | ORAL | Status: DC | PRN
  Administered 2018-07-25: 2 via ORAL
  Filled 2018-07-19: qty 2

## 2018-07-19 MED ORDER — FOLIC ACID 5 MG/ML IJ SOLN
1.0000 mg | Freq: Once | INTRAMUSCULAR | Status: AC
  Administered 2018-07-19: 1 mg via INTRAVENOUS
  Filled 2018-07-19: qty 0.2

## 2018-07-19 NOTE — Consult Note (Signed)
[facility] Vascular Consult Note  MRN : [id number]  Daisy Long is a 77 y.o. ([birth year]) female who presents with chief complaint of  Chief Complaint  Patient presents with  . Altered Mental Status  .  History of Present Illness:   I am asked to evaluate the patient by Dr. Katheren Shams.  Patient is a 77 year old woman who presented to the emergency room with mental status changes.  Work-up has demonstrated significant liver abnormalities and liver disease.  Alcohol level is noted to be 86 mg/dL.  The ER has had conversation with the patient's daughter and it is been discovered that she is a daily and heavy drinker.  Initial work-up included a chest x-ray which demonstrated significant aortic ectasia and therefore a chest CT was obtained with contrast.  This demonstrated a focal segment of mural thrombus within the descending thoracic aorta and I have been consulted to evaluate.  The patient is very confused and a very poor historian  Current Meds  Medication Sig  . Ascorbic Acid (VITAMIN C) 1000 MG tablet Take 1,000 mg daily by mouth.  Marland Kitchen b complex vitamins tablet Take 1 tablet daily by mouth.  Marland Kitchen lisinopril (PRINIVIL,ZESTRIL) 20 MG tablet TAKE ONE TABLET BY MOUTH ONCE DAILY  . vitamin B-12 (CYANOCOBALAMIN) 100 MCG tablet Take 100 mcg daily by mouth.    Past Medical History:  Diagnosis Date  . Anxiety   . Hyperlipidemia   . Hypertension   . Thyroid disease     Past Surgical History:  Procedure Laterality Date  . TUBAL LIGATION    . VAGINAL DELIVERY     X3    Social History Social History   Tobacco Use  . Smoking status: Current Some Day Smoker    Types: Cigarettes  . Smokeless tobacco: Never Used  Substance Use Topics  . Alcohol use: Yes  . Drug use: No    Family History Family History  Problem Relation Age of Onset  . Coronary artery disease Mother   . Coronary artery disease Daughter   . Breast cancer Maternal Aunt   . Breast cancer  Maternal Grandmother   No family history of bleeding/clotting disorders, porphyria or autoimmune disease obtained from chart review   Allergies  Allergen Reactions  . Levothyroxine Other (See Comments)    Increased blood pressure  . Triamterene-Hctz      REVIEW OF SYSTEMS (patient is unable to give a review of systems)  Constitutional: [] Weight loss  [] Fever  [] Chills Cardiac: [] Chest pain   [] Chest pressure   [] Palpitations   [] Shortness of breath when laying flat   [] Shortness of breath at rest   [] Shortness of breath with exertion. Vascular:  [] Pain in legs with walking   [] Pain in legs at rest   [] Pain in legs when laying flat   [] Claudication   [] Pain in feet when walking  [] Pain in feet at rest  [] Pain in feet when laying flat   [] History of DVT   [] Phlebitis   [] Swelling in legs   [] Varicose veins   [] Non-healing ulcers Pulmonary:   [] Uses home oxygen   [] Productive cough   [] Hemoptysis   [] Wheeze  [] COPD   [] Asthma Neurologic:  [] Dizziness  [] Blackouts   [] Seizures   [] History of stroke   [] History of TIA  [] Aphasia   [] Temporary blindness   [] Dysphagia   [] Weakness or numbness in arms   [] Weakness or numbness in legs Musculoskeletal:  [] Arthritis   [] Joint swelling   [] Joint pain   []   Low back pain Hematologic:  [] Easy bruising  [] Easy bleeding   [] Hypercoagulable state   [] Anemic  [] Hepatitis Gastrointestinal:  [] Blood in stool   [] Vomiting blood  [] Gastroesophageal reflux/heartburn   [] Difficulty swallowing. Genitourinary:  [] Chronic kidney disease   [] Difficult urination  [] Frequent urination  [] Burning with urination   [] Blood in urine Skin:  [] Rashes   [] Ulcers   [] Wounds Psychological:  [] History of anxiety   []  History of major depression.  Physical Examination  Vitals:   07/19/18 1600 07/19/18 1700 07/19/18 1800 07/19/18 1900  BP: 119/66 113/76 (!) 107/59 101/61  Pulse: 68     Resp: 14 13 12    Temp:    97.6 F (36.4 C)  TempSrc:    Oral  SpO2: 99%     Weight:       Height:       Body mass index is 21.29 kg/m. Gen:  WD/WN, NAD Head: [place]/AT, No temporalis wasting. Prominent temp pulse not noted. Ear/Nose/Throat: Hearing grossly intact, nares w/o erythema or drainage, oropharynx w/o Erythema/Exudate Eyes: PERRLA, EOMI.  Neck: Supple, no nuchal rigidity.  No bruit or JVD.  Pulmonary:  Good air movement, clear to auscultation bilaterally.  Cardiac: RRR, normal S1, S2, no Murmurs, rubs or gallops. Vascular:  Vessel Right Left  Radial Palpable Palpable  Brachial Palpable Palpable  Femoral Palpable Palpable  Popliteal  2+ palpable  1+ palpable  PT  not palpable  not palpable  DP  not palpable  not palpable   Gastrointestinal: soft, non-tender/non-distended. No guarding/reflex. No masses, surgical incisions, or scars. Musculoskeletal: M/S 5/5 throughout.  Extremities without ischemic changes.  No deformity or atrophy. No edema. Neurologic: CN 2-12 intact. Pain and light touch intact in extremities.  Symmetrical.  Speech is fluent. Motor exam as listed above. Psychiatric: Judgment poor, Mood & affect appropriate for pt's clinical situation. Dermatologic: Dried stool is noted on both feet no rashes or ulcers noted.  No cellulitis or open wounds. Lymph : No Cervical, Axillary, or Inguinal lymphadenopathy.    CBC Lab Results  Component Value Date   WBC 11.9 (H) 07/19/2018   HGB 12.1 07/19/2018   HCT 35.5 07/19/2018   MCV 114.8 (H) 07/19/2018   PLT 195 07/19/2018    BMET    Component Value Date/Time   NA 141 07/19/2018 1156   NA 137 03/01/2014 1303   K 4.1 07/19/2018 1156   K 3.8 03/01/2014 1303   CL 101 07/19/2018 1156   CL 106 03/01/2014 1303   CO2 23 07/19/2018 1156   CO2 28 03/01/2014 1303   GLUCOSE 102 (H) 07/19/2018 1156   GLUCOSE 120 (H) 03/01/2014 1303   BUN 10 07/19/2018 1156   BUN 14 03/01/2014 1303   CREATININE 0.67 07/19/2018 1156   CREATININE 0.63 03/01/2014 1303   CALCIUM 8.1 (L) 07/19/2018 1156   CALCIUM 8.6 03/01/2014  1303   GFRNONAA >60 07/19/2018 1156   GFRNONAA >60 03/01/2014 1303   GFRAA >60 07/19/2018 1156   GFRAA >60 03/01/2014 1303   Estimated Creatinine Clearance: 42.3 mL/min (by C-G formula based on SCr of 0.67 mg/dL).  COAG No results found for: INR, PROTIME  Radiology Ct Head Wo Contrast  Result Date: 07/19/2018 CLINICAL DATA:  Confusion EXAM: CT HEAD WITHOUT CONTRAST TECHNIQUE: Contiguous axial images were obtained from the base of the skull through the vertex without intravenous contrast. COMPARISON:  None. FINDINGS: Brain: Moderately severe atrophy without hydrocephalus. Bilateral white matter hypodensity appears chronic. Negative for acute infarct, hemorrhage, or mass. No  midline shift. Vascular: Negative for hyperdense vessel Skull: Negative Sinuses/Orbits: Negative Other: None IMPRESSION: Atrophy and chronic microvascular ischemic change in the white matter. No acute abnormality. Electronically Signed   By: Marlan Palau M.D.   On: 07/19/2018 13:08   Dg Chest Port 1 View  Result Date: 07/19/2018 CLINICAL DATA:  Confusion. EXAM: PORTABLE CHEST 1 VIEW COMPARISON:  None. FINDINGS: Enlargement of thoracic aorta is noted concerning for thoracic aortic aneurysm. Cardiac silhouette is unremarkable. No pneumothorax or pleural effusion is noted. Both lungs are clear. The visualized skeletal structures are unremarkable. IMPRESSION: Probable thoracic aortic aneurysm. CTA of the chest is recommended for further evaluation. Electronically Signed   By: Lupita Raider, M.D.   On: 07/19/2018 12:26   Ct Angio Chest Aorta W/cm &/or Wo/cm  Result Date: 07/19/2018 CLINICAL DATA:  Possible thoracic aneurysm on recent plain film EXAM: CT ANGIOGRAPHY CHEST WITH CONTRAST TECHNIQUE: Multidetector CT imaging of the chest was performed using the standard protocol during bolus administration of intravenous contrast. Multiplanar CT image reconstructions and MIPs were obtained to evaluate the vascular anatomy.  CONTRAST:  75mL ISOVUE-370 IOPAMIDOL (ISOVUE-370) INJECTION 76% COMPARISON:  Plain film from earlier in the same day, CT of the abdomen and pelvis from 11/08/2017. FINDINGS: Cardiovascular: Thoracic aorta demonstrates atherosclerotic calcification and significant tortuosity although the aorta itself is not significantly aneurysmally dilated. Maximum dimension is 3.5 cm. Sino-tubular junction measures 2.8 cm and the sinus of Valsalva 3.7 cm. Descending thoracic aorta demonstrates some hypodensity along the posterior margin of the descending thoracic aorta/proximal abdominal aorta. This was not seen on the prior CT from 11/08/2017. This likely represents soft atherosclerotic plaque or possibly mural thrombus. No evidence of dissection is seen. Pulmonary artery is well visualized. No pulmonary emboli are seen. Mediastinum/Nodes: The esophagus is within normal limits. No mediastinal or hilar adenopathy is seen. The thoracic inlet is within normal limits. Lungs/Pleura: Lungs are well aerated bilaterally. Very mild atelectatic changes are noted in the right middle lobe. Subpleural nodule is noted in the left lateral costophrenic angle measuring approximately 5 mm new from the prior exam. No other significant nodular changes are seen. Upper Abdomen: Fatty infiltration of the liver is noted. No other abdominal abnormality is seen. Musculoskeletal: Degenerative changes of the thoracic spine are noted. No compression deformities are seen. Review of the MIP images confirms the above findings. IMPRESSION: Abnormality on recent plain film represents a tortuous thoracic aorta. No true aneurysmal dilatation is seen. Hypodense mural material is noted along the descending aspect of the thoracic aorta as described. This may represent some soft atherosclerotic plaque or mural thrombus. This was not present on the previous CT examination. Fatty infiltration of the liver. No evidence of pulmonary emboli. 5 mm nodule in the left lower  lobe laterally. This was not seen on the prior CT of the abdomen and pelvis. No follow-up needed if patient is low-risk. Non-contrast chest CT can be considered in 12 months if patient is high-risk. This recommendation follows the consensus statement: Guidelines for Management of Incidental Pulmonary Nodules Detected on CT Images: From the Fleischner Society 2017; Radiology 2017; 284:228-243. Aortic Atherosclerosis (ICD10-I70.0). Electronically Signed   By: Alcide Clever M.D.   On: 07/19/2018 14:27     Assessment/Plan 1.  Mural thrombus within the descending thoracic aorta: I have reviewed personally the CT from today as well as a CT from November 2018.  Interestingly, there is no evidence of mural thrombus whatsoever from the November 2018 scan.  Also, there is  minimal atherosclerotic changes of the aorta in both scans suggesting this is indeed thrombus and not atherosclerotic plaque.  Although a vasculogenic tumor could not be ruled out given the appearance of the aorta just 8 months ago it would be unlikely.  Currently, treatment is largely based on anticoagulation and follow-up serial CT scans.  Surgery is reserved for those that have become symptomatic and embolized.  Likewise, intervention is typically reserved for this scenario as well.  Given the patient's alcohol abuse it is unlikely that she would be compliant with anticoagulation and furthermore she react presents an extremely high risk for falls and subsequent hemorrhagic complications.  Based on these factors I do not recommend anticoagulation.  At this point observation would be prudent and should she embolize intervention and/or surgery could be considered depending on her medical state at that time.  I appreciate the consult and the opportunity to participate in her care.  I do not recommend any intervention or surgery at this time.  2.  Alcohol abuse: Please see above no anticoagulation recommended at this time.   Levora DredgeGregory Schnier,  MD  07/19/2018 8:32 PM

## 2018-07-19 NOTE — ED Provider Notes (Addendum)
[facility] Emergency Department Provider Note  ____________________________________________   I have reviewed the triage vital signs and the nursing notes. Where available I have reviewed prior notes and, if possible and indicated, outside hospital notes.    HISTORY  Chief Complaint Altered Mental Status    HPI Daisy Long is a 77 y.o. female  Level 5 chart caveat; no further history available due to patient status.  Is brought in by EMS reported that according to the family she had not been heard from a week, and when they came in she was lying on the couch with apparently altered mental status.  No further history is available from her.  Patient states that she feels "fine" and that nothing is wrong.  She has no complaints.  She does not want to be in the hospital.  She was noted to be covered with dried fecal matter on her crotch area and her feet.  He denies any trauma she denies any pain he has no complaints   Past Medical History:  Diagnosis Date  . Anxiety   . Hyperlipidemia   . Hypertension   . Thyroid disease     Patient Active Problem List   Diagnosis Date Noted  . Cellulitis of umbilicus 12/31/2017  . 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 Renal failure, acute (HCC) 07/16/2017  . Leukocytosis 07/16/2017  . Hyponatremia 07/16/2017  . Acute urinary retention 07/16/2017  . Acute renal failure (ARF) (HCC) 07/16/2017  . Accidental medication overdose, initial encounter 07/16/2017  . Advance directive discussed with patient 03/29/2015  . Hyperlipidemia   . Bladder prolapse, female, acquired 08/01/2013  . Routine general medical examination at a health care facility 12/20/2012  . Subclinical hypothyroidism 09/01/2010  . Episodic mood disorder (HCC) 05/01/2010  . Essential hypertension, benign 05/01/2010    Past Surgical History:  Procedure Laterality Date  . TUBAL LIGATION    . VAGINAL DELIVERY     X3    Prior to Admission  medications   Medication Sig Start Date End Date Taking? Authorizing Provider  acyclovir (ZOVIRAX) 800 MG tablet  01/07/18   [provider]  ALPRAZolam Prudy Feeler) 0.25 MG tablet TAKE 1 TABLET BY MOUTH TWICE DAILY AS NEEDED 08/06/17   Tower, Audrie Gallus, MD  Ascorbic Acid (VITAMIN C) 1000 MG tablet Take 1,000 mg daily by mouth.    [provider]  b complex vitamins tablet Take 1 tablet daily by mouth.    [provider]  fluconazole (DIFLUCAN) 100 MG tablet Take 1 tablet (100 mg total) by mouth daily. 12/31/17   Karie Schwalbe, MD  ketoconazole (NIZORAL) 2 % cream Apply 1 application topically daily. 12/31/17   Karie Schwalbe, MD  lisinopril (PRINIVIL,ZESTRIL) 20 MG tablet TAKE ONE TABLET BY MOUTH ONCE DAILY 09/30/17   Karie Schwalbe, MD  vitamin B-12 (CYANOCOBALAMIN) 100 MCG tablet Take 100 mcg daily by mouth.    [provider]    Allergies Levothyroxine and Triamterene-hctz  Family History  Problem Relation Age of Onset  . Coronary artery disease Mother   . Coronary artery disease Daughter   . Breast cancer Maternal Aunt   . Breast cancer Maternal Grandmother     Social History Social History   Tobacco Use  . Smoking status: Current Some Day Smoker    Types: Cigarettes  . Smokeless tobacco: Never Used  Substance Use Topics  . Alcohol use: Yes  . Drug use: No    Review of Systems  Constitutional: No fever/chills Eyes: No visual changes. ENT: No sore throat. No stiff neck no neck pain Cardiovascular: Denies chest pain. Respiratory: Denies shortness of breath. Gastrointestinal:   no vomiting.  No diarrhea.  No constipation. Genitourinary: Negative for dysuria. Musculoskeletal: Negative lower extremity swelling Skin: Negative for rash. Neurological: Negative for severe headaches, focal weakness or numbness.   ____________________________________________   PHYSICAL EXAM:  VITAL SIGNS: ED Triage Vitals  Enc Vitals Group     BP --       Pulse Rate 07/19/18 1152 70     Resp 07/19/18 1152 16     Temp --      Temp src --      SpO2 07/19/18 1152 99 %     Weight 07/19/18 1154 109 lb (49.4 kg)     Height 07/19/18 1154 5' (1.524 m)     Head Circumference --      Peak Flow --      Pain Score 07/19/18 1154 0     Pain Loc --      Pain Edu? --      Excl. in GC? --     Constitutional: He is alert and follows commands knows her name, knows her birthday, when I asked her what date is she states "who gives a shit", she appears dehydrated but in no acute distress Eyes: Conjunctivae are normal Head: Atraumatic HEENT: No congestion/rhinnorhea. Mucous membranes are moist.  Oropharynx non-erythematous Neck:   Nontender with no meningismus, no masses, no stridor Cardiovascular: Normal rate, regular rhythm. Grossly normal heart sounds.  Good peripheral circulation. Respiratory: Normal respiratory effort.  No retractions. Lungs CTAB.  Minute exam secondary to patient making loud noises when asked to take a deep breath no obvious rales or rhonchi appreciated, Abdominal: Soft and nontender. No distention. No guarding no rebound Back:  There is no focal tenderness or step off.  there is no midline tenderness there are no lesions noted. there is no CVA tenderness Musculoskeletal: No lower extremity tenderness, no upper extremity tenderness. No joint effusions, no DVT signs strong distal pulses bilateral symmetric mild pitting edema mostly in the feet edema Neurologic:  Normal speech and language. No gross focal neurologic deficits are appreciated.  Skin:  Skin is warm, dry and intact. No rash noted.  Skin is dry and flaky and there is dried fecal matter in her pelvic region as well as on her feet Psychiatric: Mood and affect are normal. Speech and behavior are normal.  ____________________________________________   LABS (all labs ordered are listed, but only abnormal results are displayed)  Labs Reviewed  TSH  CK  COMPREHENSIVE  METABOLIC PANEL  CBC WITH DIFFERENTIAL/PLATELET  URINALYSIS, COMPLETE (UACMP) WITH MICROSCOPIC  TROPONIN I  LACTIC ACID, PLASMA  LACTIC ACID, PLASMA  ETHANOL  ACETAMINOPHEN LEVEL  SALICYLATE LEVEL  URINE DRUG SCREEN, QUALITATIVE ([facility] ONLY)  BRAIN NATRIURETIC PEPTIDE    Pertinent labs  results that were available during my care of the patient were reviewed by me and considered in my medical decision making (see chart for details). ____________________________________________  EKG  I personally interpreted any EKGs ordered by me or triage Sinus rhythm rate 74 bpm no acute ST elevation or depression normal axis ____________________________________________  RADIOLOGY  Pertinent labs & imaging results that were available during my care of the patient were reviewed by me and considered in my medical decision making (see chart for details). If possible, patient and/or family made aware of any abnormal findings.  No results found.  ____________________________________________    PROCEDURES  Procedure(s) performed: None  Procedures  Critical Care performed: None  ____________________________________________   INITIAL IMPRESSION / ASSESSMENT AND PLAN / ED COURSE  Pertinent labs & imaging results that were available during my care of the patient were reviewed by me and considered in my medical decision making (see chart for details).  Patient here after not being seen by family for a week.  Failure not yet here.  Is unclear therefore exactly to what extent she is altered from her baseline.  Is to be hoped and assume that she does not normally get fecal matter on her feet however.  She has no complaints.  She is somewhat irritated about being here, which she is happy to share.  She states everything is fine.  She is however 77 and clearly dehydrated we will check a total CK,'s BMP CBC urinalysis, I do not know if the patient drinks alcohol we will check an EtOH level she does not  state that she is suicidal but will check Tylenol and salicylate as a precaution, and we will check urinalysis TSH as there is some question of thyroid disease in the past, and\.  Because we cannot determine whether there was actually trauma that resulted in this, will obtain CT scan of the head.  No obvious trauma noted on exam  ----------------------------------------- 1:34 PM on 07/19/2018 -----------------------------------------  Able to discuss with her daughter, turns out the daughter reveals that the patient drinks every single day and has for years.  Never had any kind of withdrawal syndrome.  I did notice incidentally her alcohol here was elevated.  I did also find that the patient is been getting more confused over the last several weeks, and that her landlord usually checks on her and today found her to be more confused than usual.  Work-up is quite reassuring, TSH is elevated but has been that way since 2013, the patient is incidentally noted to have a possible thoracic aneurysm and will obtain CT to further evaluate that although I do not think is related to this visit.  The only person whom she talks to is her daughter who is not try to call her in the last several days, and her daughter states that she lives in [place], there is no local family.  There may be some placement issues with this patient we will consult social work.  ----------------------------------------- 4:25 PM on 07/19/2018 -----------------------------------------  Rodman Pickle is elevated unclear etiology does not appear to be septic no clear evidence of infection noted, white count is normal, could simply be dehydration and duration of use.  Ammonia is reassuring, liver function tests are up over baseline, given all these factors and the fact that she lives at home we will have her admitted.  [facility] has been made aware per social work.    ____________________________________________   FINAL CLINICAL IMPRESSION(S) / ED  DIAGNOSES  Final diagnoses:  Confusion      This chart was dictated using voice recognition software.  Despite best efforts to proofread,  errors can occur which can change meaning.      Jeanmarie Plant, MD 07/19/18 1206    Jeanmarie Plant, MD 07/19/18 5621    Jeanmarie Plant, MD 07/19/18 1337    Jeanmarie Plant, MD 07/19/18 [phone]

## 2018-07-19 NOTE — ED Notes (Signed)
Patient transported to CT 

## 2018-07-19 NOTE — Progress Notes (Signed)
Family Meeting Note  Advance Directive:yes  Today a meeting took place with the Patient.  Patient is able to participate   The following clinical team members were present during this meeting:MD  The following were discussed:Patient's diagnosis: Acute alcohol intoxication, lactic acidosis, hypothyroidism, abnormal CT of the chest, Patient's progosis: Unable to determine and Goals for treatment: Full Code  Additional follow-up to be provided: prn  Time spent during discussion:20 minutes  Bertrum SolMontell D Natalie Mceuen, MD

## 2018-07-19 NOTE — H&P (Signed)
[employer] [facility] [place] [facility]   PATIENT NAME: Daisy Long    MR#:  [id number]  DATE OF BIRTH:  07-05-1941  DATE OF ADMISSION:  07/19/2018  PRIMARY CARE PHYSICIAN: Karie Schwalbe, MD   REQUESTING/REFERRING PHYSICIAN:   CHIEF COMPLAINT:   Chief Complaint  Patient presents with  . Altered Mental Status    HISTORY OF PRESENT ILLNESS: Daisy Long  is a 77 y.o. female with a known history per below, family member had not heard from the patient within a week, found in her home with altered mental status, brought to the emergency room via EMS, patient had dried fecal matter over her body, noted confusion, ER work-up noted for alcohol level of 86, lactic acid 4, AST 114, total bili 2.9, ammonia level was normal, BNP normal, CT head noted for atrophy/no acute changes, TSS 9, urinalysis noted for ketones, urine drug screen negative, CT angiogram of the chest noted for questionable thoracic aorta plaque versus mural thrombus/fatty liver/5 mm left lung nodule, patient evaluated in the emergency room, patient is a poor historian, patient noted to have mild confusion, patient denies any pain, patient is being admitted for acute toxic metabolic encephalopathy, acute alcohol intoxication, and abnormal CT angiogram of the chest.  PAST MEDICAL HISTORY:   Past Medical History:  Diagnosis Date  . Anxiety   . Hyperlipidemia   . Hypertension   . Thyroid disease     PAST SURGICAL HISTORY:  Past Surgical History:  Procedure Laterality Date  . TUBAL LIGATION    . VAGINAL DELIVERY     X3    SOCIAL HISTORY:  Social History   Tobacco Use  . Smoking status: Current Some Day Smoker    Types: Cigarettes  . Smokeless tobacco: Never Used  Substance Use Topics  . Alcohol use: Yes    FAMILY HISTORY:  Family History  Problem Relation Age of Onset  . Coronary artery disease Mother   . Coronary artery disease Daughter   . Breast cancer Maternal Aunt   . Breast cancer Maternal  Grandmother     DRUG ALLERGIES:  Allergies  Allergen Reactions  . Levothyroxine Other (See Comments)    Increased blood pressure  . Triamterene-Hctz     REVIEW OF SYSTEMS: Poor historian due to confusion  CONSTITUTIONAL: No fever, fatigue or weakness.  EYES: No blurred or double vision.  EARS, NOSE, AND THROAT: No tinnitus or ear pain.  RESPIRATORY: No cough, shortness of breath, wheezing or hemoptysis.  CARDIOVASCULAR: No chest pain, orthopnea, edema.  GASTROINTESTINAL: No nausea, vomiting, diarrhea or abdominal pain.  GENITOURINARY: No dysuria, hematuria.  ENDOCRINE: No polyuria, nocturia,  HEMATOLOGY: No anemia, easy bruising or bleeding SKIN: No rash or lesion. MUSCULOSKELETAL: No joint pain or arthritis.   NEUROLOGIC: No tingling, numbness, weakness.  PSYCHIATRY: No anxiety or depression.   MEDICATIONS AT HOME:  Prior to Admission medications   Medication Sig Start Date End Date Taking? Authorizing Provider  Ascorbic Acid (VITAMIN C) 1000 MG tablet Take 1,000 mg daily by mouth.   Yes [provider]  b complex vitamins tablet Take 1 tablet daily by mouth.   Yes [provider]  lisinopril (PRINIVIL,ZESTRIL) 20 MG tablet TAKE ONE TABLET BY MOUTH ONCE DAILY 09/30/17  Yes Karie Schwalbe, MD  vitamin B-12 (CYANOCOBALAMIN) 100 MCG tablet Take 100 mcg daily by mouth.   Yes [provider]      PHYSICAL EXAMINATION:   VITAL SIGNS: Blood pressure 119/66, pulse 68, temperature (!)  97.1 F (36.2 C), temperature source Axillary, resp. rate 14, height 5' (1.524 m), weight 49.4 kg (109 lb), SpO2 99 %.  GENERAL:  77 y.o.-year-old patient lying in the bed with no acute distress.  Frail/thin appearing, nontoxic EYES: Pupils equal, round, reactive to light and accommodation. No scleral icterus. Extraocular muscles intact.  HEENT: Head atraumatic, normocephalic. Oropharynx and nasopharynx clear.  NECK:  Supple, no jugular venous distention. No thyroid  enlargement, no tenderness.  LUNGS: Normal breath sounds bilaterally, no wheezing, rales,rhonchi or crepitation. No use of accessory muscles of respiration.  CARDIOVASCULAR: S1, S2 normal. No murmurs, rubs, or gallops.  ABDOMEN: Soft, nontender, nondistended. Bowel sounds present. No organomegaly or mass.  EXTREMITIES: No pedal edema, cyanosis, or clubbing.  Diffuse muscular atrophy noted NEUROLOGIC: Cranial nerves II through XII are intact. MAES. Gait not checked.  PSYCHIATRIC: The patient is awake, alert, confused, oriented x 2-3.  SKIN: No obvious rash, lesion, or ulcer.   LABORATORY PANEL:   CBC Recent Labs  Lab 07/19/18 1156  WBC 11.9*  HGB 12.1  HCT 35.5  PLT 195  MCV 114.8*  MCH 39.0*  MCHC 34.0  RDW 13.2  LYMPHSABS 0.7*  MONOABS 0.4  EOSABS 0.0  BASOSABS 0.0   ------------------------------------------------------------------------------------------------------------------  Chemistries  Recent Labs  Lab 07/19/18 1156  NA 141  K 4.1  CL 101  CO2 23  GLUCOSE 102*  BUN 10  CREATININE 0.67  CALCIUM 8.1*  AST 114*  ALT 27  ALKPHOS 152*  BILITOT 2.9*   ------------------------------------------------------------------------------------------------------------------ estimated creatinine clearance is 42.3 mL/min (by C-G formula based on SCr of 0.67 mg/dL). ------------------------------------------------------------------------------------------------------------------ Recent Labs    07/19/18 1156  TSH 9.054*     Coagulation profile No results for input(s): INR, PROTIME in the last 168 hours. ------------------------------------------------------------------------------------------------------------------- No results for input(s): DDIMER in the last 72 hours. -------------------------------------------------------------------------------------------------------------------  Cardiac Enzymes Recent Labs  Lab 07/19/18 1156  TROPONINI <0.03    ------------------------------------------------------------------------------------------------------------------ Invalid input(s): POCBNP  ---------------------------------------------------------------------------------------------------------------  Urinalysis    Component Value Date/Time   COLORURINE AMBER (A) 07/19/2018 1450   APPEARANCEUR CLEAR (A) 07/19/2018 1450   APPEARANCEUR Hazy 03/01/2014 1416   LABSPEC 1.021 07/19/2018 1450   LABSPEC 1.023 03/01/2014 1416   PHURINE 5.0 07/19/2018 1450   GLUCOSEU NEGATIVE 07/19/2018 1450   GLUCOSEU Negative 03/01/2014 1416   HGBUR NEGATIVE 07/19/2018 1450   BILIRUBINUR SMALL (A) 07/19/2018 1450   BILIRUBINUR Negative 03/01/2014 1416   KETONESUR 5 (A) 07/19/2018 1450   PROTEINUR NEGATIVE 07/19/2018 1450   NITRITE NEGATIVE 07/19/2018 1450   LEUKOCYTESUR NEGATIVE 07/19/2018 1450   LEUKOCYTESUR 2+ 03/01/2014 1416     RADIOLOGY: Ct Head Wo Contrast  Result Date: 07/19/2018 CLINICAL DATA:  Confusion EXAM: CT HEAD WITHOUT CONTRAST TECHNIQUE: Contiguous axial images were obtained from the base of the skull through the vertex without intravenous contrast. COMPARISON:  None. FINDINGS: Brain: Moderately severe atrophy without hydrocephalus. Bilateral white matter hypodensity appears chronic. Negative for acute infarct, hemorrhage, or mass. No midline shift. Vascular: Negative for hyperdense vessel Skull: Negative Sinuses/Orbits: Negative Other: None IMPRESSION: Atrophy and chronic microvascular ischemic change in the white matter. No acute abnormality. Electronically Signed   By: Marlan Palauharles  Clark M.D.   On: 07/19/2018 13:08   Dg Chest Port 1 View  Result Date: 07/19/2018 CLINICAL DATA:  Confusion. EXAM: PORTABLE CHEST 1 VIEW COMPARISON:  None. FINDINGS: Enlargement of thoracic aorta is noted concerning for thoracic aortic aneurysm. Cardiac silhouette is unremarkable. No pneumothorax or pleural effusion is noted. Both lungs are  clear. The  visualized skeletal structures are unremarkable. IMPRESSION: Probable thoracic aortic aneurysm. CTA of the chest is recommended for further evaluation. Electronically Signed   By: Lupita Raider, M.D.   On: 07/19/2018 12:26   Ct Angio Chest Aorta W/cm &/or Wo/cm  Result Date: 07/19/2018 CLINICAL DATA:  Possible thoracic aneurysm on recent plain film EXAM: CT ANGIOGRAPHY CHEST WITH CONTRAST TECHNIQUE: Multidetector CT imaging of the chest was performed using the standard protocol during bolus administration of intravenous contrast. Multiplanar CT image reconstructions and MIPs were obtained to evaluate the vascular anatomy. CONTRAST:  75mL ISOVUE-370 IOPAMIDOL (ISOVUE-370) INJECTION 76% COMPARISON:  Plain film from earlier in the same day, CT of the abdomen and pelvis from 11/08/2017. FINDINGS: Cardiovascular: Thoracic aorta demonstrates atherosclerotic calcification and significant tortuosity although the aorta itself is not significantly aneurysmally dilated. Maximum dimension is 3.5 cm. Sino-tubular junction measures 2.8 cm and the sinus of Valsalva 3.7 cm. Descending thoracic aorta demonstrates some hypodensity along the posterior margin of the descending thoracic aorta/proximal abdominal aorta. This was not seen on the prior CT from 11/08/2017. This likely represents soft atherosclerotic plaque or possibly mural thrombus. No evidence of dissection is seen. Pulmonary artery is well visualized. No pulmonary emboli are seen. Mediastinum/Nodes: The esophagus is within normal limits. No mediastinal or hilar adenopathy is seen. The thoracic inlet is within normal limits. Lungs/Pleura: Lungs are well aerated bilaterally. Very mild atelectatic changes are noted in the right middle lobe. Subpleural nodule is noted in the left lateral costophrenic angle measuring approximately 5 mm new from the prior exam. No other significant nodular changes are seen. Upper Abdomen: Fatty infiltration of the liver is noted. No other  abdominal abnormality is seen. Musculoskeletal: Degenerative changes of the thoracic spine are noted. No compression deformities are seen. Review of the MIP images confirms the above findings. IMPRESSION: Abnormality on recent plain film represents a tortuous thoracic aorta. No true aneurysmal dilatation is seen. Hypodense mural material is noted along the descending aspect of the thoracic aorta as described. This may represent some soft atherosclerotic plaque or mural thrombus. This was not present on the previous CT examination. Fatty infiltration of the liver. No evidence of pulmonary emboli. 5 mm nodule in the left lower lobe laterally. This was not seen on the prior CT of the abdomen and pelvis. No follow-up needed if patient is low-risk. Non-contrast chest CT can be considered in 12 months if patient is high-risk. This recommendation follows the consensus statement: Guidelines for Management of Incidental Pulmonary Nodules Detected on CT Images: From the Fleischner Society 2017; Radiology 2017; 284:228-243. Aortic Atherosclerosis (ICD10-I70.0). Electronically Signed   By: Alcide Clever M.D.   On: 07/19/2018 14:27    EKG: Orders placed or performed during the hospital encounter of 07/19/18  . EKG 12-Lead  . EKG 12-Lead  . ED EKG  . ED EKG    IMPRESSION AND PLAN: *Acute toxic metabolic encephalopathy Most likely secondary to acute alcohol intoxication CT head noted for atrophy/no acute process Admit to regular nursing for bed, IV fluids for rehydration, neurochecks per routine, check MRI of the brain for further evaluation, low-dose aspirin for now, aspiration/fall/skin care precautions while in house, physical therapy to evaluate/treat, check RPR, ammonia level was normal, PT evaluation/treat, continue close medical monitoring  *Acute alcohol intoxication CT noted for fatty liver We will place on alcohol withdrawal protocol while in house, seizure precautions  *Acute abnormal CT of the  chest Noted for thoracic aorta plaque versus mural thrombus -we  will ask vascular surgery for expert opinion, will hold off on anticoagulation at this time given lack of symptomatology Left 5 mm lung nodule-we will recommend follow-up with pulmonology status post discharge in 3 to 6 months for reevaluation  *Acute transaminitis Suspect due to acute on chronic alcohol abuse Repeat CMP in the morning, avoid hepatotoxic agents, check liver ultrasound, alcohol cessation recommended, gastroenterology consultation for expert opinion  *Acute lactic acidosis Most likely secondary to acute alcohol intoxication IV fluids for rehydration  *Acute on chronic hypothyroidism Start Synthroid 25 mcg daily  *Acute dehydration IV fluids for rehydration   All the records are reviewed and case discussed with ED provider. Management plans discussed with the patient, family and they are in agreement.  CODE STATUS:full Code Status History    Date Active Date Inactive Code Status Order ID Comments User Context   07/16/2017 1548 07/18/2017 0024 Full Code [id number]  Katharina Caper, MD Inpatient       TOTAL TIME TAKING CARE OF THIS PATIENT: 45 minutes.    Evelena Asa Salary M.D on 07/19/2018   Between 7am to 6pm - Pager - [phone]  After 6pm go to [email or web address] - password [email or web address]  [facility]  Office  [phone]  CC: Primary care physician; Karie Schwalbe, MD   Note: This dictation was prepared with Dragon dictation along with smaller phrase technology. Any transcriptional errors that result from this process are unintentional.

## 2018-07-19 NOTE — ED Notes (Signed)
Blood and 1 set of blood cultures sent to lab by RN

## 2018-07-19 NOTE — ED Notes (Signed)
Date and time results received: 07/19/18 1354 (use smartphrase ".now" to insert current time)  Test: Lactic Acid  Critical Value: 4.0  Name of Provider Notified: MD Mcshane   Orders Received? Or Actions Taken?: Yes

## 2018-07-19 NOTE — ED Triage Notes (Signed)
Pt arrived via EMS from apartment after daughter called apt manager to check on her after not hearing from her for a week. Pt was found on couch altered LOC

## 2018-07-19 NOTE — ED Notes (Signed)
Date and time results received: 07/19/18 1613 (use smartphrase ".now" to insert current time)  Test: Lactic Acid  Critical Value: 3.7  Name of Provider Notified: Mcshane  Orders Received? Or Actions Taken?: No orders received

## 2018-07-19 NOTE — Clinical Social Work Note (Addendum)
Clinical Social Work Assessment  Patient Details  Name: Daisy Long MRN: [id number] Date of Birth: 06-08-1941  Date of referral:  07/19/18               Reason for consult:  Facility Placement                Permission sought to share information with:  Family Supports Permission granted to share information::  No  Name::        Agency::     Relationship::     Contact Information:     Housing/Transportation Living arrangements for the past 2 months:  Apartment Source of Information:  Patient Patient Interpreter Needed:  None Criminal Activity/Legal Involvement Pertinent to Current Situation/Hospitalization:  No - Comment as needed Significant Relationships:  Adult Children Lives with:  Self Do you feel safe going back to the place where you live?  Yes Need for family participation in patient care:  Yes (Comment)(If altered mental status continues)  Care giving concerns:  Patient lives alone with no local family, friends, or other supports.    Social Worker assessment / plan:  CSW received consult for "er." Patient from home alone and was found by her apartment manager after patient's daughter, who lives in [place], had not heard from patient in a week. Patient was found on her couch with altered mental status with dried feces on her crotch and feet, per notes.   CSW met with patient at bedside. Patient was evasive with her answers during assessment. Patient could not recall what happened before coming to the ED. Patient stated her day consists of "drinking 2 glasses of wine a couple times a day", watching TV, and staring at the walls. CSW attempted to clarify patient's alcohol consumption Patient initially stated she lost interest in most things, but when CSW recalled that statement patient stated she did not say that. Patient stated she has no local friends, family, or pets. Patient states her daughter who lived locally moved to [place] a few months ago and she "does not really  talk to her often." Patient stated she did not want CSW to call daughter. Patient states she cooks and cleans herself and can perform her own activities of daily living. Patient states she is not interested in assisted living or support services in the home. Patient states she feels safe in her home and "don't feel anything is wrong."   CSW updated RDP Dr. Burlene Arnt. CSW spoke with Lattie Haw at [facility], who stated patient's address is [employer]. CSW made Adult Protective Services report with Theadora Rama at [facility] at [phone]. Unit CSW to follow for discharge needs.   Employment status:  Retired Nurse, adult PT Recommendations:  Not assessed at this time [place] / Referral to community resources:  APS (Comment Required: [place], Name & Number of worker spoken with)([place])  Patient/Family's Response to care:  CSW unable to assess this as patient still with altered mental status.   Patient/Family's Understanding of and Emotional Response to Diagnosis, Current Treatment, and Prognosis:  CSW unable to assess this as patient still with altered mental status. Patient feels "fine" and does not think "anything is wrong."  Emotional Assessment Appearance:  Appears stated age Attitude/Demeanor/Rapport:  Inconsistent Affect (typically observed):    Orientation:  Oriented to Self Alcohol / Substance use:  Alcohol Use(per patient, 2 cups of wine daily, alcohol level 86) Psych involvement (Current and /or in the community):  No (Comment)  Discharge  Needs  Concerns to be addressed:  Discharge Planning Concerns, Mental Health Concerns, Lack of Support Readmission within the last 30 days:  No Current discharge risk:  Lives alone Barriers to Discharge:  Continued Medical Work up   [employer], LCSW 07/19/2018, 6:16 PM

## 2018-07-20 ENCOUNTER — Inpatient Hospital Stay: Payer: Medicare PPO

## 2018-07-20 DIAGNOSIS — F1010 Alcohol abuse, uncomplicated: Secondary | ICD-10-CM

## 2018-07-20 DIAGNOSIS — E43 Unspecified severe protein-calorie malnutrition: Secondary | ICD-10-CM

## 2018-07-20 DIAGNOSIS — F0390 Unspecified dementia without behavioral disturbance: Secondary | ICD-10-CM

## 2018-07-20 DIAGNOSIS — R410 Disorientation, unspecified: Secondary | ICD-10-CM

## 2018-07-20 LAB — BLOOD CULTURE ID PANEL (REFLEXED)
ACINETOBACTER BAUMANNII: NOT DETECTED
CANDIDA TROPICALIS: NOT DETECTED
Candida albicans: NOT DETECTED
Candida glabrata: NOT DETECTED
Candida krusei: NOT DETECTED
Candida parapsilosis: NOT DETECTED
Carbapenem resistance: NOT DETECTED
ENTEROBACTER CLOACAE COMPLEX: NOT DETECTED
ENTEROBACTERIACEAE SPECIES: DETECTED — AB
ENTEROCOCCUS SPECIES: NOT DETECTED
Escherichia coli: NOT DETECTED
HAEMOPHILUS INFLUENZAE: NOT DETECTED
Klebsiella oxytoca: NOT DETECTED
Klebsiella pneumoniae: DETECTED — AB
Listeria monocytogenes: NOT DETECTED
NEISSERIA MENINGITIDIS: NOT DETECTED
PSEUDOMONAS AERUGINOSA: NOT DETECTED
Proteus species: NOT DETECTED
STREPTOCOCCUS AGALACTIAE: NOT DETECTED
STREPTOCOCCUS SPECIES: NOT DETECTED
Serratia marcescens: NOT DETECTED
Staphylococcus aureus (BCID): NOT DETECTED
Staphylococcus species: NOT DETECTED
Streptococcus pneumoniae: NOT DETECTED
Streptococcus pyogenes: NOT DETECTED

## 2018-07-20 LAB — COMPREHENSIVE METABOLIC PANEL
ALK PHOS: 129 U/L — AB (ref 38–126)
ALT: 22 U/L (ref 0–44)
AST: 90 U/L — AB (ref 15–41)
Albumin: 2 g/dL — ABNORMAL LOW (ref 3.5–5.0)
Anion gap: 9 (ref 5–15)
BILIRUBIN TOTAL: 2.6 mg/dL — AB (ref 0.3–1.2)
BUN: 10 mg/dL (ref 8–23)
CALCIUM: 7.3 mg/dL — AB (ref 8.9–10.3)
CO2: 26 mmol/L (ref 22–32)
Chloride: 106 mmol/L (ref 98–111)
Creatinine, Ser: 0.53 mg/dL (ref 0.44–1.00)
GFR calc Af Amer: >60 mL/min (ref >60)
Glucose, Bld: 74 mg/dL (ref 70–99)
Potassium: 3.4 mmol/L — ABNORMAL LOW (ref 3.5–5.1)
Sodium: 141 mmol/L (ref 135–145)
TOTAL PROTEIN: 5.1 g/dL — AB (ref 6.5–8.1)

## 2018-07-20 LAB — MAGNESIUM: Magnesium: 1.6 mg/dL — ABNORMAL LOW (ref 1.7–2.4)

## 2018-07-20 LAB — PROTIME-INR
INR: 1.34
Prothrombin Time: 16.5 seconds — ABNORMAL HIGH (ref 11.4–15.2)

## 2018-07-20 LAB — PHOSPHORUS: PHOSPHORUS: 2.8 mg/dL (ref 2.5–4.6)

## 2018-07-20 MED ORDER — SODIUM CHLORIDE 0.9 % IV SOLN
2.0000 g | INTRAVENOUS | Status: DC
  Administered 2018-07-20 – 2018-07-22 (×3): 2 g via INTRAVENOUS
  Filled 2018-07-20 (×2): qty 2
  Filled 2018-07-20: qty 20

## 2018-07-20 MED ORDER — SODIUM CHLORIDE 0.9 % IV SOLN
1.0000 g | INTRAVENOUS | Status: DC
  Filled 2018-07-20: qty 10

## 2018-07-20 MED ORDER — HALOPERIDOL LACTATE 5 MG/ML IJ SOLN
0.5000 mg | Freq: Four times a day (QID) | INTRAMUSCULAR | Status: DC | PRN
  Administered 2018-07-24: 0.5 mg via INTRAVENOUS
  Filled 2018-07-20: qty 1

## 2018-07-20 MED ORDER — ALBUMIN HUMAN 25 % IV SOLN
25.0000 g | Freq: Every day | INTRAVENOUS | Status: AC
  Administered 2018-07-20 – 2018-07-22 (×3): 25 g via INTRAVENOUS
  Filled 2018-07-20 (×5): qty 100

## 2018-07-20 MED ORDER — ENSURE ENLIVE PO LIQD
237.0000 mL | Freq: Three times a day (TID) | ORAL | Status: DC
  Administered 2018-07-21 – 2018-07-23 (×5): 237 mL via ORAL

## 2018-07-20 MED ORDER — POTASSIUM CHLORIDE CRYS ER 20 MEQ PO TBCR
40.0000 meq | EXTENDED_RELEASE_TABLET | Freq: Once | ORAL | Status: AC
  Administered 2018-07-20: 10:00:00 40 meq via ORAL
  Filled 2018-07-20: qty 2

## 2018-07-20 MED ORDER — MAGNESIUM SULFATE 2 GM/50ML IV SOLN
2.0000 g | Freq: Once | INTRAVENOUS | Status: AC
  Administered 2018-07-20: 12:00:00 2 g via INTRAVENOUS
  Filled 2018-07-20: qty 50

## 2018-07-20 NOTE — Progress Notes (Signed)
PT Cancellation Note  Patient Details Name: Daisy Long MRN: [id number] DOB: 11-26-1941   Cancelled Treatment:    Reason Eval/Treat Not Completed: Patient declined, no reason specified.  PT consult received.  Chart reviewed.  Pt firmly refusing PT d/t being "too tired" and wanting to sleep instead.  Nurse notified.  Will re-attempt PT evaluation at a later date/time.  Hendricks LimesEmily Lady Wisham, PT 07/20/18, 12:19 PM [phone]

## 2018-07-20 NOTE — Evaluation (Signed)
Physical Therapy Evaluation Patient Details Name: Daisy Long MRN: [id number] DOB: 04/30/1941 Today's Date: 07/20/2018   History of Present Illness  Daisy Long is a 77 y.o. Female.  Is brought in by EMS reported that according to the family she had not been heard from a week, and when they came in she was lying on the couch with apparently altered mental status. Patient states that she feels "fine" and that nothing is wrong. Pt is in alcohol withdrawal. CT Shows potential aortic thrombus. Per MD no restrictions for PT as of 07/20/18   Clinical Impression  Pt was more agreeable to therapy than previous attempt. Pt was agreeable on PT arrival, and was willing to try some light activities. Pt was having hallucinations, reporting seeing people leave the room and rain outside. Pt was A+Ox1. Was able to carry on conversation with therapist inconsistently, and occasionally forgot PT identify, and reason for presence.  Pt willingly performed some exercises in chair, but after a few reps declined any further. Pt declined to stand and ambulate, but did perform partial sit to stand independently before deciding she did not want to continue. Pt was not a good historian and could not provide many details regarding home and prior level of function. PT was not able to fully assess pt today due to above details and overall cognitive status secondary to medical status. However given pt's prior level of function and observable strength, expect pt to be able to return home upon discharge from acute hospitalization pending medical improvement. Recommend transition to HHPT.      Follow Up Recommendations Home health PT    Equipment Recommendations       Recommendations for Other Services       Precautions / Restrictions Precautions Precautions: Fall Restrictions Weight Bearing Restrictions: No      Mobility  Bed Mobility               General bed mobility comments: Unable to assess due to pt not  permitting PT to transfer pt from recliner to bed  Transfers                 General transfer comment: Unable to assess due to pt not permitting PT to transfer pt. Pt did do partial sit to stand before deciding she did not wish to stand and walk.   Ambulation/Gait             General Gait Details: Pt did not wish to walk today, stating she "wants to rest"  Stairs            Wheelchair Mobility    Modified Rankin (Stroke Patients Only)       Balance                                             Pertinent Vitals/Pain Pain Assessment: Faces Faces Pain Scale: Hurts little more Pain Location: Low back Pain Descriptors / Indicators: Aching(Pt reports back hurts at all times) Pain Intervention(s): Limited activity within patient's tolerance;Repositioned;Utilized relaxation techniques    Home Living Family/patient expects to be discharged to:: Private residence Living Arrangements: Alone Available Help at Discharge: Other (Comment)(Per chart review pt stats no friends available, and family is out of state and has minimal contact. ) Type of Home: Apartment           Additional Comments: Information  above per chart review, further info will be determiend as pt congitive status allows    Prior Function Level of Independence: Independent               Hand Dominance        Extremity/Trunk Assessment   Upper Extremity Assessment Upper Extremity Assessment: Overall WFL for tasks assessed    Lower Extremity Assessment Lower Extremity Assessment: Overall WFL for tasks assessed       Communication   Communication: Other (comment)(Pt is expereincing hallucinations, and is unable to stay focsued on conversation or commands)  Cognition Arousal/Alertness: Awake/alert Behavior During Therapy: Agitated;Impulsive Overall Cognitive Status: Impaired/Different from baseline Area of Impairment: Attention;Following commands;Orientation                  Orientation Level: Person Current Attention Level: Alternating   Following Commands: Follows multi-step commands inconsistently;Follows one step commands with increased time       General Comments: Pt is experiencing hallucinations of people, animals, and weather events.       General Comments General comments (skin integrity, edema, etc.): Unable to assess today due to patient refusal    Exercises Other Exercises Other Exercises: BLE: SLR, Heel slides, ankle pumps x 3 each. Pt refused more reps.  Other Exercises: BUE: [place] flex x 1  Pt refused more reps.  Other Exercises: Partial sit to stand x1 ind    Assessment/Plan    PT Assessment Patient needs continued PT services  PT Problem List Decreased cognition;Decreased activity tolerance;Decreased mobility       PT Treatment Interventions DME instruction;Gait training;Stair training;Balance training;Therapeutic exercise;Therapeutic activities;Patient/family education;Functional mobility training    PT Goals (Current goals can be found in the Care Plan section)  Acute Rehab PT Goals PT Goal Formulation: Patient unable to participate in goal setting    Frequency Min 2X/week   Barriers to discharge        Co-evaluation               AM-PAC PT "6 Clicks" Daily Activity  Outcome Measure Difficulty turning over in bed (including adjusting bedclothes, sheets and blankets)?: A Little Difficulty moving from lying on back to sitting on the side of the bed? : A Little Difficulty sitting down on and standing up from a chair with arms (e.g., wheelchair, bedside commode, etc,.)?: A Little Help needed moving to and from a bed to chair (including a wheelchair)?: A Little Help needed walking in hospital room?: A Lot Help needed climbing 3-5 steps with a railing? : A Lot 6 Click Score: 16    End of Session Equipment Utilized During Treatment: Gait belt Activity Tolerance: Treatment limited secondary to  agitation;Patient limited by lethargy Patient left: in chair;with chair alarm set;with call bell/phone within reach Nurse Communication: Mobility status;Other (comment)(Reported pt's cont'd hallucinations and pt pulling at IV)      Time: [id number] PT Time Calculation (min) (ACUTE ONLY): 24 min   Charges:             Grayland Jackolby Renn Stille, SPT 07/20/18,5:31 PM

## 2018-07-20 NOTE — Progress Notes (Signed)
Patient left floor and transported by hospital bed via [facility] for MRI around 0220 and returned from MRI around 0253 back to patient's room. Patient is currently resting and has no complaints at this time. Will continue to monitor patient to end of shift.

## 2018-07-20 NOTE — Consult Note (Signed)
PHARMACY CONSULT NOTE   Pharmacy Consult for Electrolyte Monitoring  Indication: High Risk for Refeeding Syndrome  LABS: Potassium (mmol/L)  Date Value  07/20/2018 3.4 (L)  03/01/2014 3.8   Magnesium (mg/dL)  Date Value  16/10/960407/31/2019 1.6 (L)  03/01/2014 1.6 (L)   Phosphorus (mg/dL)  Date Value  54/09/811907/31/2019 2.8   Calcium (mg/dL)  Date Value  14/78/295607/31/2019 7.3 (L)   Calcium, Total (mg/dL)  Date Value  21/30/865703/11/2014 8.6   Albumin (g/dL)  Date Value  84/69/629507/31/2019 2.0 (L)  10/30/2013 3.5  ]  Estimated Creatinine Clearance: 44.4 mL/min (by C-G formula based on SCr of 0.53 mg/dL).    Assessment: Pharmacy consulted for electrolyte monitoring and replacement in 77 yo female at risk for refeeding syndrome.   Goal of Therapy:  Electrolytes WNL  Plan:  7/31: K: 3.4, Mg: 1.6, Phos: 2.8, Corrected Ca:8.9 Magnesium 2g IV x 1 dose and KCL 40mEq PO ordered.  No additional replacement needed at this time. Will recheck electrolytes with AM labs and continue to replace as needed.   Gardner CandleSheema M Abdullah Rizzi, PharmD, BCPS Clinical Pharmacist 07/20/2018 12:21 PM

## 2018-07-20 NOTE — Progress Notes (Signed)
PHARMACY - PHYSICIAN COMMUNICATION CRITICAL VALUE ALERT - BLOOD CULTURE IDENTIFICATION (BCID)  Daisy Long is an 77 y.o. female who [facility]2018 with a chief complaint of AMS w/ suspected alcohol w/d  Assessment:  Vitals WNL, UA negative, source unknown, possibly intra-abdominal, 2/4 Aerobic/anaerobic GNR BCID Enterobacteriacaea Klebsiella Pneumoniae [facility] -  Name of physician (or Provider) Contacted: Joycelyn RuaMichael Diamond  Current antibiotics: None  Changes to prescribed antibiotics recommended:  Recommendations accepted by provider -- Will start patient on ceftriaxone 2g IV daily for possibly klebsiella bacteremia s/t unknown source, possibly intra-abdominal d/t EtOH abuse  Results for orders placed or performed during the hospital encounter of 07/19/18  Blood Culture ID Panel (Reflexed) (Collected: 07/19/2018 11:56 AM)  Result Value Ref Range   Enterococcus species NOT DETECTED NOT DETECTED   Listeria monocytogenes NOT DETECTED NOT DETECTED   Staphylococcus species NOT DETECTED NOT DETECTED   Staphylococcus aureus NOT DETECTED NOT DETECTED   Streptococcus species NOT DETECTED NOT DETECTED   Streptococcus agalactiae NOT DETECTED NOT DETECTED   Streptococcus pneumoniae NOT DETECTED NOT DETECTED   Streptococcus pyogenes NOT DETECTED NOT DETECTED   Acinetobacter baumannii NOT DETECTED NOT DETECTED   Enterobacteriaceae species DETECTED (A) NOT DETECTED   Enterobacter cloacae complex NOT DETECTED NOT DETECTED   Escherichia coli NOT DETECTED NOT DETECTED   Klebsiella oxytoca NOT DETECTED NOT DETECTED   Klebsiella pneumoniae DETECTED (A) NOT DETECTED   Proteus species NOT DETECTED NOT DETECTED   Serratia marcescens NOT DETECTED NOT DETECTED   Carbapenem resistance NOT DETECTED NOT DETECTED   Haemophilus influenzae NOT DETECTED NOT DETECTED   Neisseria meningitidis NOT DETECTED NOT DETECTED   Pseudomonas aeruginosa NOT DETECTED NOT DETECTED   Candida albicans NOT  DETECTED NOT DETECTED   Candida glabrata NOT DETECTED NOT DETECTED   Candida krusei NOT DETECTED NOT DETECTED   Candida parapsilosis NOT DETECTED NOT DETECTED   Candida tropicalis NOT DETECTED NOT DETECTED   Thomasene Rippleavid Tamora Huneke, PharmD, BCPS Clinical Pharmacist 07/20/2018

## 2018-07-20 NOTE — Progress Notes (Signed)
[facility] [place] [facility]   PATIENT NAME: Daisy Long    MR#:  [id number]  DATE OF BIRTH:  08-Sep-1941  SUBJECTIVE:  CHIEF COMPLAINT:   Chief Complaint  Patient presents with  . Altered Mental Status   - found at home in unkempt condition - very confused today, hallucinating  REVIEW OF SYSTEMS:  Review of Systems  Unable to perform ROS: Mental status change    DRUG ALLERGIES:   Allergies  Allergen Reactions  . Levothyroxine Other (See Comments)    Increased blood pressure  . Triamterene-Hctz     VITALS:  Blood pressure 112/64, pulse 73, temperature 97.8 F (36.6 C), temperature source Oral, resp. rate 16, height 5\' 1"  (1.549 m), weight 49.4 kg (109 lb), SpO2 98 %.  PHYSICAL EXAMINATION:  Physical Exam  GENERAL:  77 y.o.-year-old ill nourished patient lying in the bed with no acute distress.  EYES: Pupils equal, round, reactive to light and accommodation. No scleral icterus. Extraocular muscles intact.  HEENT: Head atraumatic, normocephalic. Oropharynx and nasopharynx clear.  NECK:  Supple, no jugular venous distention. No thyroid enlargement, no tenderness.  LUNGS: Normal breath sounds bilaterally, no wheezing, rales,rhonchi or crepitation. No use of accessory muscles of respiration.  Decreased bibasilar breath sounds CARDIOVASCULAR: S1, S2 normal. No rubs, or gallops.  2/6 systolic murmur present ABDOMEN: Soft, nontender, nondistended. Bowel sounds present. No organomegaly or mass.  EXTREMITIES: No pedal edema, cyanosis, or clubbing.  NEUROLOGIC: Cranial nerves II through XII are intact. Muscle strength 5/5 in all extremities. Sensation intact. Gait not checked.  Global weakness noted PSYCHIATRIC: The patient is alert and oriented to self, hallucinating.  SKIN: No obvious rash, lesion, or ulcer.    LABORATORY PANEL:   CBC Recent Labs  Lab 07/19/18 1156  WBC 11.9*  HGB 12.1  HCT 35.5  PLT 195    ------------------------------------------------------------------------------------------------------------------  Chemistries  Recent Labs  Lab 07/20/18 0353  NA 141  K 3.4*  CL 106  CO2 26  GLUCOSE 74  BUN 10  CREATININE 0.53  CALCIUM 7.3*  MG 1.6*  AST 90*  ALT 22  ALKPHOS 129*  BILITOT 2.6*   ------------------------------------------------------------------------------------------------------------------  Cardiac Enzymes Recent Labs  Lab 07/19/18 1156  TROPONINI <0.03   ------------------------------------------------------------------------------------------------------------------  RADIOLOGY:  Ct Head Wo Contrast  Result Date: 07/19/2018 CLINICAL DATA:  Confusion EXAM: CT HEAD WITHOUT CONTRAST TECHNIQUE: Contiguous axial images were obtained from the base of the skull through the vertex without intravenous contrast. COMPARISON:  None. FINDINGS: Brain: Moderately severe atrophy without hydrocephalus. Bilateral white matter hypodensity appears chronic. Negative for acute infarct, hemorrhage, or mass. No midline shift. Vascular: Negative for hyperdense vessel Skull: Negative Sinuses/Orbits: Negative Other: None IMPRESSION: Atrophy and chronic microvascular ischemic change in the white matter. No acute abnormality. Electronically Signed   By: Marlan Palau M.D.   On: 07/19/2018 13:08   Mr Brain Wo Contrast  Result Date: 07/20/2018 CLINICAL DATA:  Initial evaluation for acute confusion. EXAM: MRI HEAD WITHOUT CONTRAST TECHNIQUE: Multiplanar, multiecho pulse sequences of the brain and surrounding structures were obtained without intravenous contrast. COMPARISON:  Prior CT from 07/19/2018 FINDINGS: Brain: Generalized age-related cerebral atrophy. Patchy T2/FLAIR hyperintensity within the periventricular and deep white matter both cerebral hemispheres most consistent with chronic small vessel ischemic disease, mild in nature. No abnormal foci of restricted diffusion to  suggest acute or subacute ischemia. Gray-white matter differentiation maintained. No encephalomalacia to suggest chronic infarction. No evidence for acute or chronic intracranial hemorrhage. No mass  lesion, midline shift or mass effect. No hydrocephalus. No extra-axial fluid collection. Pituitary gland within normal limits. Vascular: Major intracranial vascular flow voids maintained Skull and upper cervical spine: Craniocervical junction normal. Upper cervical spine grossly within normal limits. Bone marrow signal intensity normal. No scalp soft tissue abnormality. Sinuses/Orbits: Globes and orbital soft tissues within normal limits. Paranasal sinuses are clear. Trace opacity right mastoid air cells, of doubtful significance. Inner ear structures normal. Other: None. IMPRESSION: 1. No acute intracranial abnormality. 2. Age-related cerebral atrophy with mild chronic small vessel ischemic disease. Electronically Signed   By: Rise Mu M.D.   On: 07/20/2018 03:14   Dg Chest Port 1 View  Result Date: 07/19/2018 CLINICAL DATA:  Confusion. EXAM: PORTABLE CHEST 1 VIEW COMPARISON:  None. FINDINGS: Enlargement of thoracic aorta is noted concerning for thoracic aortic aneurysm. Cardiac silhouette is unremarkable. No pneumothorax or pleural effusion is noted. Both lungs are clear. The visualized skeletal structures are unremarkable. IMPRESSION: Probable thoracic aortic aneurysm. CTA of the chest is recommended for further evaluation. Electronically Signed   By: Lupita Raider, M.D.   On: 07/19/2018 12:26   Ct Angio Chest Aorta W/cm &/or Wo/cm  Result Date: 07/19/2018 CLINICAL DATA:  Possible thoracic aneurysm on recent plain film EXAM: CT ANGIOGRAPHY CHEST WITH CONTRAST TECHNIQUE: Multidetector CT imaging of the chest was performed using the standard protocol during bolus administration of intravenous contrast. Multiplanar CT image reconstructions and MIPs were obtained to evaluate the vascular anatomy.  CONTRAST:  75mL ISOVUE-370 IOPAMIDOL (ISOVUE-370) INJECTION 76% COMPARISON:  Plain film from earlier in the same day, CT of the abdomen and pelvis from 11/08/2017. FINDINGS: Cardiovascular: Thoracic aorta demonstrates atherosclerotic calcification and significant tortuosity although the aorta itself is not significantly aneurysmally dilated. Maximum dimension is 3.5 cm. Sino-tubular junction measures 2.8 cm and the sinus of Valsalva 3.7 cm. Descending thoracic aorta demonstrates some hypodensity along the posterior margin of the descending thoracic aorta/proximal abdominal aorta. This was not seen on the prior CT from 11/08/2017. This likely represents soft atherosclerotic plaque or possibly mural thrombus. No evidence of dissection is seen. Pulmonary artery is well visualized. No pulmonary emboli are seen. Mediastinum/Nodes: The esophagus is within normal limits. No mediastinal or hilar adenopathy is seen. The thoracic inlet is within normal limits. Lungs/Pleura: Lungs are well aerated bilaterally. Very mild atelectatic changes are noted in the right middle lobe. Subpleural nodule is noted in the left lateral costophrenic angle measuring approximately 5 mm new from the prior exam. No other significant nodular changes are seen. Upper Abdomen: Fatty infiltration of the liver is noted. No other abdominal abnormality is seen. Musculoskeletal: Degenerative changes of the thoracic spine are noted. No compression deformities are seen. Review of the MIP images confirms the above findings. IMPRESSION: Abnormality on recent plain film represents a tortuous thoracic aorta. No true aneurysmal dilatation is seen. Hypodense mural material is noted along the descending aspect of the thoracic aorta as described. This may represent some soft atherosclerotic plaque or mural thrombus. This was not present on the previous CT examination. Fatty infiltration of the liver. No evidence of pulmonary emboli. 5 mm nodule in the left lower  lobe laterally. This was not seen on the prior CT of the abdomen and pelvis. No follow-up needed if patient is low-risk. Non-contrast chest CT can be considered in 12 months if patient is high-risk. This recommendation follows the consensus statement: Guidelines for Management of Incidental Pulmonary Nodules Detected on CT Images: From the Fleischner Society 2017; Radiology 2017;  [id number]. Aortic Atherosclerosis (ICD10-I70.0). Electronically Signed   By: Alcide CleverMark  Lukens M.D.   On: 07/19/2018 14:27    EKG:   Orders placed or performed during the hospital encounter of 07/19/18  . EKG 12-Lead  . EKG 12-Lead  . ED EKG  . ED EKG    ASSESSMENT AND PLAN:   77 year old female with past medical history significant for alcohol abuse, hypertension, thyroid disease was brought in secondary to altered mental status  1.  Altered mental status-likely toxic metabolic encephalopathy -Alcohol intoxication and now likely going through delirium tremens -CT of the head with atrophy and chronic microvascular ischemic changes but no acute abnormality -Ammonia is within normal limits -Psych consult noted  2.  Electrolyte abnormalities-hypokalemia and hypomagnesemia being replaced -Pharmacy has been consulted.  Dietary consult for possible refeeding syndrome  3.  Lactic acidosis-concern for starvation ketosis -Improving with fluids  4.  Elevated LFTs-likely alcoholic liver cirrhosis.  Ultrasound ordered for tomorrow a.m. -Monitor LFTs.  Replaced  5.  Hypertension- lisinopril  6.  Gram-negative bacteremia-unknown source at this time.  Patient was laying in her feces.  Urine analysis negative for infection.  Started on Rocephin  7.  DVT prophylaxis-on Lovenox  PT consult when stable    All the records are reviewed and case discussed with Care Management/Social Workerr. Management plans discussed with the patient, family and they are in agreement.  CODE STATUS: Full code  TOTAL TIME TAKING CARE OF  THIS PATIENT: 38 minutes.   POSSIBLE D/C IN 2-3 DAYS, DEPENDING ON CLINICAL CONDITION.   Enid BaasKALISETTI,Kiersten Coss M.D on 07/20/2018 at 1:25 PM  Between 7am to 6pm - Pager - [phone]  After 6pm go to [email or web address] - Social research officer, governmentpassword EPAS [facility]  [facility]  Office  [phone]  CC: Primary care physician; Karie SchwalbeLetvak, Richard I, MD

## 2018-07-20 NOTE — Consult Note (Signed)
GI Inpatient Consult Note  Reason for Consult: Transaminitis    Attending Requesting Consult: Dr. Nemiah Commander  History of Present Illness: Daisy Long is a 77 y.o. female seen for evaluation of acute toxic metabolic encephalopathy s/t acute alcohol intoxication with transaminiitis at the request of Dr. Nemiah Commander, MD. Pt has PMH of alcohol abuse, hyperlipidemia, hypertension, anxiety, and thyroid disease. Pt was found in her home with AMS and confusion and dried fecal matter all over her body after family members had not heard from her in a week. ED work-up yesterday showed alcohol level of 86, lactic acid 4, AST 114, bilirubin 2.9. Today AST 90, bilirubin 2.6, abdominal ultrasound pending.  Pt seen and examined bedside this afternoon resting comfortably in hospital bed. She denies any acute complaints at this time. She denies abdominal pain. She reports bowel habits are normal with no overt rectal bleeding. She denies nausea, vomiting, dysphagia, odynophagia, early satiety, or unintentional weight loss. Appetite is a little reduced and she doesn't feel hungry. She is hallucinating currently, noting objects that are not in room. She reports she drinks alcohol frequently, but is unsure how much she drinks a day. Pt is a poor historian and confused.   Last EGD/Colonoscopy: Unsure, records not available via EMR    Past Medical History:  Past Medical History:  Diagnosis Date  . Anxiety   . Hyperlipidemia   . Hypertension   . Thyroid disease     Problem List: Patient Active Problem List   Diagnosis Date Noted  . Protein-calorie malnutrition, severe 07/20/2018  . Toxic metabolic encephalopathy 07/19/2018  . Cellulitis of umbilicus 12/31/2017  . 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 Renal failure, acute (HCC) 07/16/2017  . Leukocytosis 07/16/2017  . Hyponatremia 07/16/2017  . Acute urinary retention 07/16/2017  . Acute renal failure (ARF) (HCC) 07/16/2017  . Accidental medication  overdose, initial encounter 07/16/2017  . Advance directive discussed with patient 03/29/2015  . Hyperlipidemia   . Bladder prolapse, female, acquired 08/01/2013  . Routine general medical examination at a health care facility 12/20/2012  . Subclinical hypothyroidism 09/01/2010  . Episodic mood disorder (HCC) 05/01/2010  . Essential hypertension, benign 05/01/2010    Past Surgical History: Past Surgical History:  Procedure Laterality Date  . TUBAL LIGATION    . VAGINAL DELIVERY     X3    Allergies: Allergies  Allergen Reactions  . Levothyroxine Other (See Comments)    Increased blood pressure  . Triamterene-Hctz     Home Medications: Medications Prior to Admission  Medication Sig Dispense Refill Last Dose  . Ascorbic Acid (VITAMIN C) 1000 MG tablet Take 1,000 mg daily by mouth.   Taking  . b complex vitamins tablet Take 1 tablet daily by mouth.   Taking  . lisinopril (PRINIVIL,ZESTRIL) 20 MG tablet TAKE ONE TABLET BY MOUTH ONCE DAILY 90 tablet 1 Taking  . vitamin B-12 (CYANOCOBALAMIN) 100 MCG tablet Take 100 mcg daily by mouth.   Taking   Home medication reconciliation was completed with the patient.   Scheduled Inpatient Medications:   . aspirin EC  81 mg Oral Daily  . enoxaparin (LOVENOX) injection  40 mg Subcutaneous Q24H  . feeding supplement (ENSURE ENLIVE)  237 mL Oral TID BM  . folic acid  1 mg Oral Daily  . levothyroxine  25 mcg Oral QAC breakfast  . lisinopril  20 mg Oral Daily  . multivitamin with minerals  1 tablet Oral Daily  . thiamine  100 mg Oral Daily  Or  . thiamine  100 mg Intravenous Daily    Continuous Inpatient Infusions:   . albumin human 25 g (07/20/18 1013)  . cefTRIAXone (ROCEPHIN)  IV Stopped (07/20/18 0710)  . banana bag IV 1000 mL Stopped (07/20/18 1002)    PRN Inpatient Medications:  albuterol, HYDROcodone-acetaminophen, LORazepam **OR** LORazepam, ondansetron **OR** ondansetron (ZOFRAN) IV, polyethylene glycol  Family  History: family history includes Breast cancer in her maternal aunt and maternal grandmother; Coronary artery disease in her daughter and mother.  The patient's family history is negative for inflammatory bowel disorders, GI malignancy, or solid organ transplantation.  Social History:   reports that she has been smoking cigarettes.  She has never used smokeless tobacco. She reports that she drinks alcohol. She reports that she does not use drugs. The patient denies ETOH, tobacco, or drug use.   Review of Systems:  Full ROS unable to be performed due to patient's mental status   Physical Examination: BP 112/64 (BP Location: Left Arm)   Pulse 73   Temp 97.8 F (36.6 C) (Oral)   Resp 16   Ht 5\' 1"  (1.549 m)   Wt 49.4 kg (109 lb)   SpO2 98%   BMI 20.60 kg/m  Pleasant, non-toxic appearing elderly female resting comfortably in hospital bed. Pt is poor historian and unable to answer all questions.  Gen: NAD, alert and oriented to self HEENT: PEERLA, EOMI, Neck: supple, no JVD or thyromegaly Chest: CTA bilaterally, no wheezes, crackles, or other adventitious sounds CV: RRR, no m/g/c/r Abd: soft, NT, ND, +BS in all four quadrants; no HSM, guarding, ridigity, or rebound tenderness Ext: no edema, well perfused with 2+ pulses, Skin: no rash or lesions noted Neuro: confused, pt is hallucinating noting objects in room that are not there Lymph: no LAD  Data: Lab Results  Component Value Date   WBC 11.9 (H) 07/19/2018   HGB 12.1 07/19/2018   HCT 35.5 07/19/2018   MCV 114.8 (H) 07/19/2018   PLT 195 07/19/2018   Recent Labs  Lab 07/19/18 1156  HGB 12.1   Lab Results  Component Value Date   NA 141 07/20/2018   K 3.4 (L) 07/20/2018   CL 106 07/20/2018   CO2 26 07/20/2018   BUN 10 07/20/2018   CREATININE 0.53 07/20/2018   Lab Results  Component Value Date   ALT 22 07/20/2018   AST 90 (H) 07/20/2018   ALKPHOS 129 (H) 07/20/2018   BILITOT 2.6 (H) 07/20/2018   No results for  input(s): APTT, INR, PTT in the last 168 hours. Assessment/Plan: Ms. Daisy Long is a 77 y/o Caucasian female with a PMH of alcohol abuse, hyperlipidemia, hypertension, anxiety, and thyroid disease admitted for toxic metabolic encephalopathy likely s/t acute alcohol intoxication with transaminitis. Likely EtOH hepatitis  1. Alcohol abuse: 2. Transaminitis - AST 2x >ALT consistent with EtOH hepatitis 3. Toxic metabolic encephalopathy: - AST 2x greater than ALT, consistent with alcoholic hepatitis - Pt likely going through delirium tremens. Avoid all hepatotoxic agents. - Continue to treat symptomatically - Patient is not a candidate for steroid treatment at this time due to Maddrey's discriminant function score of 23.6 (<32, does not meet criteria for steroid treatment) - Abdominal ultrasound scheduled for tomorrow morning - Continue to follow LFTs - Will continue to follow along   Thank you for the consult. Please call with questions or concerns.  Gilda CreaseGranville M Croley, PA-C [facility]  [phone]

## 2018-07-20 NOTE — Progress Notes (Signed)
Attending MD - Nemiah CommanderKalisetti - made aware of dark tarry stool this shift. Stated we would continue to monitor hemoglobin. Patient to be NPO after midnight for ABD US tomorrow. Bo McclintockBrewer,Gaberial Cada S, RN

## 2018-07-20 NOTE — Consult Note (Signed)
[facility] Consult   Reason for Consult: Consult for this 77 year old woman brought into the hospital with acute mental status change Referring Physician: Sudini Patient Identification: Daisy Long MRN:  [id number] Principal Diagnosis: Acute delirium Diagnosis:   Patient Active Problem List   Diagnosis Date Noted  . Protein-calorie malnutrition, severe [E43] 07/20/2018  . Acute delirium [R41.0] 07/20/2018  . Dementia [F03.90] 07/20/2018  . Alcohol abuse [F10.10] 07/20/2018  . Toxic metabolic encephalopathy [T01] 07/19/2018  . Cellulitis of umbilicus [X79.390] 30/08/2329  . 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 Renal failure, acute ([place]) [N17.9] 07/16/2017  . Leukocytosis [D72.829] 07/16/2017  . Hyponatremia [E87.1] 07/16/2017  . Acute urinary retention [R33.8] 07/16/2017  . Acute renal failure (ARF) ([place]) [N17.9] 07/16/2017  . Accidental medication overdose, initial encounter [T50.901A] 07/16/2017  . Advance directive discussed with patient [Z71.89] 03/29/2015  . Hyperlipidemia [E78.5]   . Bladder prolapse, female, acquired [N81.10] 08/01/2013  . Routine general medical examination at a health care facility [Z00.00] 12/20/2012  . Subclinical hypothyroidism [E03.9] 09/01/2010  . Episodic mood disorder ([place]) [F39] 05/01/2010  . Essential hypertension, benign [I10] 05/01/2010    Total Time spent with patient: 1 hour  Subjective:   Daisy Long is a 77 y.o. female patient admitted with "I was here before".  HPI: Patient seen.  Chart reviewed.  This is a 77 year old woman who was brought into the hospital by EMS.  Apparently her daughter, who lives in [place], became concerned when she had not heard from her mother and could not reach her on the telephone.  She had someone go to check on her and they found the patient unconscious with evidence of soiling herself.  Brought her into the hospital.  Patient is confused. evidence of soiling herself.  Brought her into the hospital.  Patient is confused.  When I saw her she was awake  sitting in a chair and not agitated but clearly confused.  She thought that she could tell me where we were right now but she was not actually able to say it or even describe where she was.  Confabulated a few things about her husband supposedly having brought her here to the hospital.  Did not really know why she was in the hospital.  Could not tell me where she lives.  Insisted that she lived with her husband who evidently from what I can tell is deceased.  Patient was calm not agitated or overly emotional.  Denied suicidal ideation.  During the course of our conversation she indicated that she was hearing voices outside the window of her room none of which were actually there.  Patient's blood pressure is low.  She is not tremulous or agitated.  Multiple lab abnormalities show evidence of heavy drinking and liver damage.  Apparently it was a known thing that the patient drank wine every day.  Not clear how much she was drinking.  Social history: Living by herself.  Used to live with her daughter who moved to [place] sometime within the last year.  Notes suggest that the patient's spirits and function had been declining since her daughter moved.  Medical history: Multiple medical problems.  Past history of cellulitis which was I think the last time she was actually in the hospital.  History of hyperlipidemia hypothyroidism  Substance abuse history: Evidently the fact that she drank was a known thing although it did not seem to be considered a problem in the past.  Probably has ratcheted up the amount that she is drinking.  No previous treatment that we can identify for  alcohol abuse or any drug abuse  Past Psychiatric History: Mood disorder as listed on the chart and in some of the old primary care doctor notes.  No evidence that I can see of any past psychiatric hospitalization or suicidality or psychotic features.  Risk to Self:   Risk to Others:   Prior Inpatient Therapy:   Prior Outpatient  Therapy:    Past Medical History:  Past Medical History:  Diagnosis Date  . Anxiety   . Hyperlipidemia   . Hypertension   . Thyroid disease     Past Surgical History:  Procedure Laterality Date  . TUBAL LIGATION    . VAGINAL DELIVERY     X3   Family History:  Family History  Problem Relation Age of Onset  . Coronary artery disease Mother   . Coronary artery disease Daughter   . Breast cancer Maternal Aunt   . Breast cancer Maternal Grandmother    Family Psychiatric  History: None known Social History:  Social History   Substance and Sexual Activity  Alcohol Use Yes     Social History   Substance and Sexual Activity  Drug Use No    Social History   Socioeconomic History  . Marital status: Widowed    Spouse name: Not on file  . Number of children: 3  . Years of education: Not on file  . Highest education level: Not on file  Occupational History  . Occupation: retired as IT trainer  Social Needs  . Financial resource strain: Not on file  . Food insecurity:    Worry: Not on file    Inability: Not on file  . Transportation needs:    Medical: Not on file    Non-medical: Not on file  Tobacco Use  . Smoking status: Current Some Day Smoker    Types: Cigarettes  . Smokeless tobacco: Never Used  Substance and Sexual Activity  . Alcohol use: Yes  . Drug use: No  . Sexual activity: Not on file  Lifestyle  . Physical activity:    Days per week: Not on file    Minutes per session: Not on file  . Stress: Not on file  Relationships  . Social connections:    Talks on phone: Not on file    Gets together: Not on file    Attends religious service: Not on file    Active member of club or organization: Not on file    Attends meetings of clubs or organizations: Not on file    Relationship status: Not on file  Other Topics Concern  . Not on file  Social History Narrative   Has living will.    Daughter Daisy Long has health care POA.   Would accept  resuscitation but would not want prolonged artificial ventilation   Would not want feeding tube if cognitively unaware               Additional Social History:    Allergies:   Allergies  Allergen Reactions  . Levothyroxine Other (See Comments)    Increased blood pressure  . Triamterene-Hctz     Labs:  Results for orders placed or performed during the hospital encounter of 07/19/18 (from the past 48 hour(s))  TSH     Status: Abnormal   Collection Time: 07/19/18 11:56 AM  Result Value Ref Range   TSH 9.054 (H) 0.350 - 4.500 uIU/mL    Comment: Performed by a 3rd Generation assay with a functional sensitivity of <=0.01 uIU/mL. Performed  at [facility], [address] [place], [place] [address]   CK     Status: Abnormal   Collection Time: 07/19/18 11:56 AM  Result Value Ref Range   Total CK 19 (L) 38 - 234 U/L    Comment: Performed at [facility], [address] [place], [place] [address]  Comprehensive metabolic panel     Status: Abnormal   Collection Time: 07/19/18 11:56 AM  Result Value Ref Range   Sodium 141 135 - 145 mmol/L   Potassium 4.1 3.5 - 5.1 mmol/L   Chloride 101 98 - 111 mmol/L   CO2 23 22 - 32 mmol/L   Glucose, Bld 102 (H) 70 - 99 mg/dL   BUN 10 8 - 23 mg/dL   Creatinine, Ser 0.67 0.44 - 1.00 mg/dL   Calcium 8.1 (L) 8.9 - 10.3 mg/dL   Total Protein 6.6 6.5 - 8.1 g/dL   Albumin 2.6 (L) 3.5 - 5.0 g/dL   AST 114 (H) 15 - 41 U/L   ALT 27 0 - 44 U/L   Alkaline Phosphatase 152 (H) 38 - 126 U/L   Total Bilirubin 2.9 (H) 0.3 - 1.2 mg/dL   GFR calc non Af Amer >60 >60 mL/min   GFR calc Af Amer >60 >60 mL/min    Comment: (NOTE) The eGFR has been calculated using the CKD EPI equation. This calculation has not been validated in all clinical situations. eGFR's persistently <60 mL/min signify possible Chronic Kidney Disease.    Anion gap 17 (H) 5 - 15    Comment: Performed at [facility], [address] [place], [place]  [address]  CBC with Differential     Status: Abnormal   Collection Time: 07/19/18 11:56 AM  Result Value Ref Range   WBC 11.9 (H) 3.6 - 11.0 K/uL   RBC 3.10 (L) 3.80 - 5.20 MIL/uL   Hemoglobin 12.1 12.0 - 16.0 g/dL   HCT 35.5 35.0 - 47.0 %   MCV 114.8 (H) 80.0 - 100.0 fL   MCH 39.0 (H) 26.0 - 34.0 pg   MCHC 34.0 32.0 - 36.0 g/dL   RDW 13.2 11.5 - 14.5 %   Platelets 195 150 - 440 K/uL   Neutrophils Relative % 91 %   Neutro Abs 10.7 (H) 1.4 - 6.5 K/uL   Lymphocytes Relative 6 %   Lymphs Abs 0.7 (L) 1.0 - 3.6 K/uL   Monocytes Relative 3 %   Monocytes Absolute 0.4 0.2 - 0.9 K/uL   Eosinophils Relative 0 %   Eosinophils Absolute 0.0 0 - 0.7 K/uL   Basophils Relative 0 %   Basophils Absolute 0.0 0 - 0.1 K/uL    Comment: Performed at [facility], [address] [place], [place] [address]  Troponin I     Status: None   Collection Time: 07/19/18 11:56 AM  Result Value Ref Range   Troponin I <0.03 <0.03 ng/mL    Comment: Performed at [facility], [address] [place], [place] [address]  Ethanol     Status: Abnormal   Collection Time: 07/19/18 11:56 AM  Result Value Ref Range   Alcohol, Ethyl (B) 86 (H) <10 mg/dL    Comment: (NOTE) Lowest detectable limit for serum alcohol is 10 mg/dL. For medical purposes only. Performed at [facility], [address] [place], [place] [address]   Acetaminophen level     Status: Abnormal   Collection Time: 07/19/18 11:56 AM  Result Value Ref Range  Acetaminophen (Tylenol), Serum <10 (L) 10 - 30 ug/mL    Comment: (NOTE) Therapeutic concentrations vary significantly. A range of 10-30 ug/mL  may be an effective concentration for many patients. However, some  are best treated at concentrations outside of this range. Acetaminophen concentrations >150 ug/mL at 4 hours after ingestion  and >50 ug/mL at 12 hours after ingestion are often associated with  toxic reactions. Performed at [facility], [address] [place], [place] [address]   Salicylate level     Status: None   Collection Time: 07/19/18 11:56 AM  Result Value Ref Range   Salicylate Lvl <0.9 2.8 - 30.0 mg/dL    Comment: Performed at [facility], [address] [place], [place] [address]  Brain natriuretic peptide     Status: Abnormal   Collection Time: 07/19/18 11:56 AM  Result Value Ref Range   B Natriuretic Peptide 134.0 (H) 0.0 - 100.0 pg/mL    Comment: Performed at [facility], [address] [place], [place] [address]  Culture, blood (routine x 2)     Status: None (Preliminary result)   Collection Time: 07/19/18 11:56 AM  Result Value Ref Range   Specimen Description      BLOOD RIGHT ANTECUBITAL Performed at [facility], [address], [place], [place] [address]    Special Requests      BOTTLES DRAWN AEROBIC AND ANAEROBIC Blood Culture adequate volume   Culture  Setup Time      Organism ID to follow GRAM NEGATIVE RODS IN BOTH AEROBIC AND ANAEROBIC BOTTLES CRITICAL RESULT CALLED TO, READ BACK BY AND VERIFIED WITH: DAVID BESANTI AT [place] 07/20/18 Yoe Performed at [facility], [address] [place], [place] [address]    Culture GRAM NEGATIVE RODS    Report Status PENDING   Blood Culture ID Panel (Reflexed)     Status: Abnormal   Collection Time: 07/19/18 11:56 AM  Result Value Ref Range   Enterococcus species NOT DETECTED NOT DETECTED   Listeria monocytogenes NOT DETECTED NOT DETECTED   Staphylococcus species NOT DETECTED NOT DETECTED   Staphylococcus aureus NOT DETECTED NOT DETECTED   Streptococcus species NOT DETECTED NOT DETECTED   Streptococcus agalactiae NOT DETECTED NOT DETECTED   Streptococcus pneumoniae NOT DETECTED NOT DETECTED   Streptococcus pyogenes NOT DETECTED NOT DETECTED   Acinetobacter baumannii NOT DETECTED NOT DETECTED   Enterobacteriaceae species DETECTED (A) NOT DETECTED    Comment: Enterobacteriaceae represent a large family of  gram-negative bacteria, not a single organism. CRITICAL RESULT CALLED TO, READ BACK BY AND VERIFIED WITH: DAVID BESANTI AT 9798 07/20/18 SDR    Enterobacter cloacae complex NOT DETECTED NOT DETECTED   Escherichia coli NOT DETECTED NOT DETECTED   Klebsiella oxytoca NOT DETECTED NOT DETECTED   Klebsiella pneumoniae DETECTED (A) NOT DETECTED    Comment: CRITICAL RESULT CALLED TO, READ BACK BY AND VERIFIED WITH:  DAVID BESANTI AT 0517 07/20/18 SDR    Proteus species NOT DETECTED NOT DETECTED   Serratia marcescens NOT DETECTED NOT DETECTED   Carbapenem resistance NOT DETECTED NOT DETECTED   Haemophilus influenzae NOT DETECTED NOT DETECTED   Neisseria meningitidis NOT DETECTED NOT DETECTED   Pseudomonas aeruginosa NOT DETECTED NOT DETECTED   Candida albicans NOT DETECTED NOT DETECTED   Candida glabrata NOT DETECTED NOT DETECTED   Candida krusei NOT DETECTED NOT DETECTED   Candida parapsilosis NOT DETECTED NOT DETECTED   Candida tropicalis NOT DETECTED NOT DETECTED    Comment: Performed  at [facility], [address] [place], [place] [address]  Lactic acid, plasma     Status: Abnormal   Collection Time: 07/19/18  1:21 PM  Result Value Ref Range   Lactic Acid, Venous 4.0 (HH) 0.5 - 1.9 mmol/L    Comment: CRITICAL RESULT CALLED TO, READ BACK BY AND VERIFIED WITH CHRISTINA JAMES 07/19/18 1350 JML/KLW Performed at [facility], [address] [place], [place] [address]   Urinalysis, Complete w Microscopic     Status: Abnormal   Collection Time: 07/19/18  2:50 PM  Result Value Ref Range   Color, Urine AMBER (A) YELLOW    Comment: BIOCHEMICALS MAY BE AFFECTED BY COLOR   APPearance CLEAR (A) CLEAR   Specific Gravity, Urine 1.021 1.005 - 1.030   pH 5.0 5.0 - 8.0   Glucose, UA NEGATIVE NEGATIVE mg/dL   Hgb urine dipstick NEGATIVE NEGATIVE   Bilirubin Urine SMALL (A) NEGATIVE   Ketones, ur 5 (A) NEGATIVE mg/dL   Protein, ur NEGATIVE NEGATIVE mg/dL   Nitrite NEGATIVE  NEGATIVE   Leukocytes, UA NEGATIVE NEGATIVE   WBC, UA 0-5 0 - 5 WBC/hpf   Bacteria, UA NONE SEEN NONE SEEN   Squamous Epithelial / LPF 0-5 0 - 5   Mucus PRESENT    Hyaline Casts, UA PRESENT    Ca Oxalate Crys, UA PRESENT     Comment: Performed at [facility], [address] [place], [place] [address]  Urine Drug Screen, Qualitative     Status: None   Collection Time: 07/19/18  2:50 PM  Result Value Ref Range   Tricyclic, Ur Screen NONE DETECTED NONE DETECTED   Amphetamines, Ur Screen NONE DETECTED NONE DETECTED   MDMA (Ecstasy)Ur Screen NONE DETECTED NONE DETECTED   Cocaine Metabolite,Ur [place] NONE DETECTED NONE DETECTED   Opiate, Ur Screen NONE DETECTED NONE DETECTED   Phencyclidine (PCP) Ur S NONE DETECTED NONE DETECTED   Cannabinoid 50 Ng, Ur [place] NONE DETECTED NONE DETECTED   Barbiturates, Ur Screen NONE DETECTED NONE DETECTED   Benzodiazepine, Ur Scrn NONE DETECTED NONE DETECTED   Methadone Scn, Ur NONE DETECTED NONE DETECTED    Comment: (NOTE) Tricyclics + metabolites, urine    Cutoff 1000 ng/mL Amphetamines + metabolites, urine  Cutoff 1000 ng/mL MDMA (Ecstasy), urine              Cutoff 500 ng/mL Cocaine Metabolite, urine          Cutoff 300 ng/mL Opiate + metabolites, urine        Cutoff 300 ng/mL Phencyclidine (PCP), urine         Cutoff 25 ng/mL Cannabinoid, urine                 Cutoff 50 ng/mL Barbiturates + metabolites, urine  Cutoff 200 ng/mL Benzodiazepine, urine              Cutoff 200 ng/mL Methadone, urine                   Cutoff 300 ng/mL The urine drug screen provides only a preliminary, unconfirmed analytical test result and should not be used for non-medical purposes. Clinical consideration and professional judgment should be applied to any positive drug screen result due to possible interfering substances. A more specific alternate chemical method must be used in order to obtain a confirmed analytical result. Gas chromatography / mass  spectrometry (GC/MS) is the preferred confirmat ory method. Performed at [facility], [address] [place], [place] [address]   Lactic acid, plasma     Status: Abnormal   Collection Time: 07/19/18  3:27 PM  Result Value Ref Range   Lactic Acid, Venous 3.7 (HH) 0.5 - 1.9 mmol/L    Comment: CRITICAL RESULT CALLED TO, READ BACK BY AND VERIFIED WITH CHRISTINA JANE 07/19/18 1615 KLW Performed at [facility], [address] [place], [place] [address]   Ammonia     Status: None   Collection Time: 07/19/18  3:27 PM  Result Value Ref Range   Ammonia 20 9 - 35 umol/L    Comment: Performed at [facility], [address] [place], [place] [address]  Culture, blood (routine x 2)     Status: None (Preliminary result)   Collection Time: 07/19/18  5:01 PM  Result Value Ref Range   Specimen Description BLOOD RIGHT ANTECUBITAL    Special Requests      Blood Culture results may not be optimal due to an inadequate volume of blood received in culture bottles   Culture      NO GROWTH < 12 HOURS Performed at [facility], [address] [place], [place] [address]    Report Status PENDING   Lactic acid, plasma     Status: None   Collection Time: 07/19/18  9:21 PM  Result Value Ref Range   Lactic Acid, Venous 1.3 0.5 - 1.9 mmol/L    Comment: Performed at [facility], [address] [place], [place] [address]  Comprehensive metabolic panel     Status: Abnormal   Collection Time: 07/20/18  3:53 AM  Result Value Ref Range   Sodium 141 135 - 145 mmol/L   Potassium 3.4 (L) 3.5 - 5.1 mmol/L   Chloride 106 98 - 111 mmol/L   CO2 26 22 - 32 mmol/L   Glucose, Bld 74 70 - 99 mg/dL   BUN 10 8 - 23 mg/dL   Creatinine, Ser 0.53 0.44 - 1.00 mg/dL   Calcium 7.3 (L) 8.9 - 10.3 mg/dL   Total Protein 5.1 (L) 6.5 - 8.1 g/dL   Albumin 2.0 (L) 3.5 - 5.0 g/dL   AST 90 (H) 15 - 41 U/L   ALT 22 0 - 44 U/L   Alkaline Phosphatase 129 (H) 38 - 126 U/L   Total  Bilirubin 2.6 (H) 0.3 - 1.2 mg/dL   GFR calc non Af Amer >60 >60 mL/min   GFR calc Af Amer >60 >60 mL/min    Comment: (NOTE) The eGFR has been calculated using the CKD EPI equation. This calculation has not been validated in all clinical situations. eGFR's persistently <60 mL/min signify possible Chronic Kidney Disease.    Anion gap 9 5 - 15    Comment: Performed at [facility], [address] [place], [place] [address]  Magnesium     Status: Abnormal   Collection Time: 07/20/18  3:53 AM  Result Value Ref Range   Magnesium 1.6 (L) 1.7 - 2.4 mg/dL    Comment: Performed at [facility], [address] [place], [place] [address]  Phosphorus     Status: None   Collection Time: 07/20/18  3:53 AM  Result Value Ref Range   Phosphorus 2.8 2.5 - 4.6 mg/dL    Comment: Performed at [facility], [address] [place], [place] [address]  Protime-INR     Status: Abnormal   Collection Time: 07/20/18  2:43 PM  Result Value Ref Range   Prothrombin  Time 16.5 (H) 11.4 - 15.2 seconds   INR 1.34     Comment: Performed at [facility], [address] [place], [place] [address]    Current Facility-Administered Medications  Medication Dose Route Frequency Provider Last Rate Last Dose  . albumin human 25 % solution 25 g  25 g Intravenous Daily Gladstone Lighter, MD 60 mL/hr at 07/20/18 1013 25 g at 07/20/18 1013  . albuterol (PROVENTIL) (2.5 MG/3ML) 0.083% nebulizer solution 2.5 mg  2.5 mg Nebulization Q2H PRN Salary, Montell D, MD      . aspirin EC tablet 81 mg  81 mg Oral Daily Salary, Montell D, MD   81 mg at 07/20/18 1000  . cefTRIAXone (ROCEPHIN) 2 g in sodium chloride 0.9 % 100 mL IVPB  2 g Intravenous Q24H Harrie Foreman, MD   Stopped at 07/20/18 0710  . enoxaparin (LOVENOX) injection 40 mg  40 mg Subcutaneous Q24H Salary, Montell D, MD   40 mg at 07/19/18 2349  . feeding supplement (ENSURE ENLIVE) (ENSURE ENLIVE) liquid 237 mL  237 mL Oral TID BM  Gladstone Lighter, MD      . folic acid (FOLVITE) tablet 1 mg  1 mg Oral Daily Salary, Montell D, MD   1 mg at 07/20/18 1000  . HYDROcodone-acetaminophen (NORCO/VICODIN) 5-325 MG per tablet 1-2 tablet  1-2 tablet Oral Q4H PRN Salary, Montell D, MD      . levothyroxine (SYNTHROID, LEVOTHROID) tablet 25 mcg  25 mcg Oral QAC breakfast Salary, Montell D, MD   25 mcg at 07/20/18 1000  . lisinopril (PRINIVIL,ZESTRIL) tablet 20 mg  20 mg Oral Daily Salary, Montell D, MD   20 mg at 07/20/18 1000  . LORazepam (ATIVAN) tablet 1 mg  1 mg Oral Q6H PRN Salary, Avel Peace, MD       Or  . LORazepam (ATIVAN) injection 1 mg  1 mg Intravenous Q6H PRN Salary, Montell D, MD      . multivitamin with minerals tablet 1 tablet  1 tablet Oral Daily Salary, Montell D, MD   1 tablet at 07/20/18 1000  . ondansetron (ZOFRAN) tablet 4 mg  4 mg Oral Q6H PRN Salary, Montell D, MD       Or  . ondansetron (ZOFRAN) injection 4 mg  4 mg Intravenous Q6H PRN Salary, Montell D, MD      . polyethylene glycol (MIRALAX / GLYCOLAX) packet 17 g  17 g Oral Daily PRN Salary, Montell D, MD      . sodium chloride 0.9 % 1,000 mL with thiamine 297 mg, folic acid 1 mg, multivitamins adult 10 mL infusion   Intravenous Continuous Salary, Montell D, MD 100 mL/hr at 07/20/18 1347    . thiamine (VITAMIN B-1) tablet 100 mg  100 mg Oral Daily Salary, Montell D, MD   100 mg at 07/20/18 1000   Or  . thiamine (B-1) injection 100 mg  100 mg Intravenous Daily Salary, Montell D, MD        Musculoskeletal: Strength & Muscle Tone: decreased Gait & Station: unsteady Patient leans: N/A  Psychiatric Specialty Exam: Physical Exam  Nursing note and vitals reviewed. Constitutional: She appears well-developed and well-nourished.  HENT:  Head: Normocephalic and atraumatic.  Eyes: Pupils are equal, round, and reactive to light. Conjunctivae are normal.  Neck: Normal range of motion.  Cardiovascular: Regular rhythm and normal heart sounds.  Respiratory:  Effort normal. No respiratory distress.  GI: Soft.  Musculoskeletal: Normal range of motion.  Neurological:  She is alert.  Skin: Skin is warm and dry.  Psychiatric: Her affect is blunt and inappropriate. Her speech is tangential. She is withdrawn. She is not agitated. Thought content is delusional. Thought content is not paranoid. Cognition and memory are impaired. She expresses inappropriate judgment. She expresses no homicidal and no suicidal ideation. She exhibits abnormal recent memory and abnormal remote memory. She is inattentive.    Review of Systems  Constitutional: Negative.   HENT: Negative.   Eyes: Negative.   Respiratory: Negative.   Cardiovascular: Negative.   Gastrointestinal: Positive for nausea.  Musculoskeletal: Negative.   Skin: Negative.   Neurological: Negative.   Psychiatric/Behavioral: Positive for hallucinations and memory loss. Negative for depression, substance abuse and suicidal ideas. The patient is nervous/anxious. The patient does not have insomnia.     Blood pressure (!) 91/54, pulse 78, temperature 98 F (36.7 C), temperature source Oral, resp. rate 18, height 5' 1"  (1.549 m), weight 49.4 kg (109 lb), SpO2 98 %.Body mass index is 20.6 kg/m.  General Appearance: Casual  Eye Contact:  Minimal  Speech:  Slow  Volume:  Decreased  Mood:  Dysphoric  Affect:  Constricted  Thought Process:  Disorganized  Orientation:  Negative  Thought Content:  Illogical and Hallucinations: Auditory  Suicidal Thoughts:  No  Homicidal Thoughts:  No  Memory:  Immediate;   Fair Recent;   Poor Remote;   Poor  Judgement:  Impaired  Insight:  Shallow  Psychomotor Activity:  Decreased  Concentration:  Concentration: Poor  Recall:  Poor  Fund of Knowledge:  Fair  Language:  Fair  Akathisia:  No  Handed:  Right  AIMS (if indicated):     Assets:  Housing Social Support  ADL's:  Impaired  Cognition:  Impaired,  Mild and Moderate  Sleep:        Treatment Plan  Summary: Medication management and Plan This is a 77 year old woman who came into the hospital with altered mental status.  On evaluation today the patient was delirious and demented.  Confused during the interview with some waxing and waning of mental status but even when she seemed to be most attentive she clearly has major memory problems.  It is possible she may have developed a Korsakoff's type dementia.  Time will tell about that.  She may be having some complicated alcohol withdrawal but her blood pressure is low and she is at high fall risk so I am hesitant to add any benzodiazepine.  I will add very small amounts of as needed antipsychotic that could be used if she is sundowning or getting agitated or having trouble sleeping at night.  We will continue to follow.  In her current condition she lacks any capacity to make decisions for herself.  Disposition: Supportive therapy provided about ongoing stressors.  Alethia Berthold, MD 07/20/2018 5:39 PM

## 2018-07-20 NOTE — Progress Notes (Signed)
Initial Nutrition Assessment  DOCUMENTATION CODES:   Severe malnutrition in context of social or environmental circumstances  INTERVENTION:  Recommend liberalizing diet to regular.  Provide Ensure Enlive po TID, each supplement provides 350 kcal and 20 grams of protein.   If patient tries Ensure and decides she will not drink it, can instead provide Boost Breeze po 5 times daily, each supplement provides 250 kcal and 9 grams of protein.  Provide Magic cup TID with meals, each supplement provides 290 kcal and 9 grams of protein.  Continue MVI daily, thiamine 812 mg daily, folic acid 1 mg daily in setting of EtOH abuse.  Monitor magnesium, potassium, and phosphorus daily for at least 3 days, MD to replete as needed, as pt is at risk for refeeding syndrome given severe malnutrition, EtOH abuse.  NUTRITION DIAGNOSIS:   Severe Malnutrition related to social / environmental circumstances(EtOH abuse) as evidenced by severe fat depletion, severe muscle depletion.  GOAL:   Patient will meet greater than or equal to 90% of their needs  MONITOR:   PO intake, Supplement acceptance, Labs, Weight trends, I & O's  REASON FOR ASSESSMENT:   Malnutrition Screening Tool, Consult Assessment of nutrition requirement/status  ASSESSMENT:   77 year old female with PMHx of anxiety, hypothyroidism, HTN, HLD, EtOH abuse who is admitted with acute toxic metabolic encephalopathy most likely secondary to acute alcohol intoxication, noted for thoracic aorta plaque versus mural thrombus on CT of chest (plan is to monitor and not anticoagulate), acute transaminitis, acute lactic acidosis.   Met with patient at bedside. She does not seem to be the best historian. She reports she has had a poor appetite for a long time now. She only eats one meal per day. She reports she typically has vegetables at her one meal. She reports she does not eat any meat/fish, beans/legumes, dairy, eggs, nuts/nut butters regularly.  She also does not drink any oral nutrition supplements regularly. Patient does not have a consistent source of protein in her diet. Per chart patient is a daily and heavy drinker which is likely contributing to her inadequate oral intake. Patient is amenable to trying Ensure Enlive and [employer]. However, she reports she does not like "artificial things" so she may not like them. Can also try Boost Breeze if patient will not drink Ensure. At breakfast today patient only had a bite of muffin and is drinking juice.  Patient reports she is unsure of UBW or exact weight history. Only reports she "used to be up there quite a bit." Per chart she was 149.1 lbs on 07/17/2017. Weight has been trending down in chart, but they appear to be reported weights. Unsure if current weight is accurate and there were too may pads/blankets/pillows on bed to get an accurate weight today.  Medications reviewed and include: folic acid 1 mg daily, levothyroxine, MVI daily, potassium chloride 40 mEq once today, thiamine 100 mg daily, human albumin 25 grams daily IV, ceftriaxone, NS (with thiamine 751 mg, folic acid 1 mg, MVI 10 mL) at 100 mL/hr.  Labs reviewed: Potassium 3.4, Magnesium 1.6. Phosphorus WNL today. Ethyl alcohol level 86 on 7/30, BNP 134 on 7/30.  Discussed with SLP. Patient approved for regular texture diet. Prefers to drink liquids than eat meals.  NUTRITION - FOCUSED PHYSICAL EXAM:    Most Recent Value  Orbital Region  Severe depletion  Upper Arm Region  Severe depletion  Thoracic and Lumbar Region  Moderate depletion  Buccal Region  Severe depletion  Herald  Region  Severe depletion  Clavicle Bone Region  Severe depletion  Clavicle and Acromion Bone Region  Severe depletion  Scapular Bone Region  Severe depletion  Dorsal Hand  Severe depletion  Patellar Region  Unable to assess  Anterior Thigh Region  Unable to assess  Posterior Calf Region  Unable to assess  Edema (RD Assessment)  Mild [bilateral  lower extremities]  Hair  Reviewed [dull and dry]  Eyes  Reviewed  Mouth  Reviewed  Skin  Reviewed [ecchymosis and abrasions]  Nails  Reviewed     Diet Order:   Diet Order           Diet NPO time specified  Diet effective midnight        Diet Heart Room service appropriate? Yes; Fluid consistency: Thin  Diet effective now          EDUCATION NEEDS:   Not appropriate for education at this time  Skin:  Skin Assessment: Reviewed RN Assessment  Last BM:  Unknown/PTA  Height:   Ht Readings from Last 1 Encounters:  07/19/18 5' 1" (1.549 m)    Weight:   Wt Readings from Last 1 Encounters:  07/19/18 109 lb (49.4 kg)    Ideal Body Weight:  47.7 kg  BMI:  Body mass index is 20.6 kg/m.  Estimated Nutritional Needs:   Kcal:  1480-1730 (30-35 kcal/kg)  Protein:  75-85 grams (1.5-1.7 grams/kg)  Fluid:  1.2-1.5 L/day (25-30 mL/kg)  Willey Blade, MS, RD, LDN Office: [phone] Pager: [phone] After Hours/Weekend Pager: [phone]

## 2018-07-20 NOTE — Evaluation (Signed)
Clinical/Bedside Swallow Evaluation Patient Details  Name: Consuela MimesJanice M Camp MRN: [id number] Date of Birth: 03/16/1941  Today's Date: 07/20/2018 Time: SLP Start Time (ACUTE ONLY): 40980822 SLP Stop Time (ACUTE ONLY): 11910922 SLP Time Calculation (min) (ACUTE ONLY): 60 min  Past Medical History:  Past Medical History:  Diagnosis Date  . Anxiety   . Hyperlipidemia   . Hypertension   . Thyroid disease    Past Surgical History:  Past Surgical History:  Procedure Laterality Date  . TUBAL LIGATION    . VAGINAL DELIVERY     X3   HPI:  Pt is a 77 y.o. female with a known history of ETOH abuse, anxiety, thyroid dis., HTN whose family member had not heard from the patient within a week, found in her home with altered mental status, brought to the emergency room via EMS, patient had dried fecal matter over her body, noted confusion, ER work-up noted for alcohol level of 86, lactic acid 4, AST 114, total bili 2.9, ammonia level was normal, BNP normal, CT head noted for atrophy/no acute changes, TSS 9, urinalysis noted for ketones, urine drug screen negative, CT angiogram of the chest noted for questionable thoracic aorta plaque versus mural thrombus/fatty liver/5 mm left lung nodule, patient evaluated in the emergency room, patient is a poor historian, patient noted to have mild confusion, patient denies any pain, patient is being admitted for acute toxic metabolic encephalopathy, acute alcohol intoxication, and abnormal CT angiogram of the chest. Pt is currently talking to her "daughter" she feels is in the room; noting objects/items that are not there. She can follow commands w/ min verbal cues; appears easily distracted. She did NOT want much foods but DRANK liquids easily.    Assessment / Plan / Recommendation Clinical Impression  Pt appears to present w/ adequate oropharyngeal phase swallowing function w/ reduced risk for aspiration when following general aspiration precautions - pt needed postioning fully  upright in bed; she is frail appearing. Pt consumed ~10ozs of thin liquids via Straw w/ no overt s/s of aspiration noted; clear vocal quality b/t trials, no throat clearing or coughing. Same was noted w/ trials of purees and soft solids. Pt is easily distracted and did NOT want to eat foods. During the oral phase, pt demonstrated adequate bolus management of the softer foods, purees, and liquids. Pt was encouraged to choose foods that would be easy to masticate/swallow. Recommend a Dysphagia level 3 [facility](MECH SOFT foods)/Regular foods (preferences) w/ thin liquids; Pills in Puree - WHOLE IF needed for easier swallowing; Aspiration precautions and tray setup at meals. NSG to reconsult if any decline in status while admitted. Recommended f/u w/ Dietician and possibly Psychiatry d/t AMS.  SLP Visit Diagnosis: Dysphagia, unspecified (R13.10)(appeared wfl)    Aspiration Risk  (reduced )    Diet Recommendation  Regular/mech soft diet consistency; Thin liquids. General aspiration precautions. Tray setup at meals; reduce distractions  Medication Administration: Whole meds with liquid(or Whole in Puree if easier for swallowing)    Other  Recommendations Recommended Consults: (Dietician; Psychiatry) Oral Care Recommendations: Oral care BID;Staff/trained caregiver to provide oral care;Patient independent with oral care Other Recommendations: (n/a)   Follow up Recommendations None      Frequency and Duration (n/a)  (n/a)       Prognosis Prognosis for Safe Diet Advancement: Good Barriers to Reach Goals: Cognitive deficits;Behavior(AMS)      Swallow Study   General Date of Onset: 07/19/18 HPI: Pt is a 77 y.o. female with a known history  of ETOH abuse, anxiety, thyroid dis., HTN whose family member had not heard from the patient within a week, found in her home with altered mental status, brought to the emergency room via EMS, patient had dried fecal matter over her body, noted confusion, ER work-up noted  for alcohol level of 86, lactic acid 4, AST 114, total bili 2.9, ammonia level was normal, BNP normal, CT head noted for atrophy/no acute changes, TSS 9, urinalysis noted for ketones, urine drug screen negative, CT angiogram of the chest noted for questionable thoracic aorta plaque versus mural thrombus/fatty liver/5 mm left lung nodule, patient evaluated in the emergency room, patient is a poor historian, patient noted to have mild confusion, patient denies any pain, patient is being admitted for acute toxic metabolic encephalopathy, acute alcohol intoxication, and abnormal CT angiogram of the chest. Pt is currently talking to her "daughter" she feels is in the room; noting objects/items that are not there. She can follow commands w/ min verbal cues; appears easily distracted. She did NOT want much foods but DRANK liquids easily.  Type of Study: Bedside Swallow Evaluation Previous Swallow Assessment: none reported Diet Prior to this Study: Regular;Thin liquids Temperature Spikes Noted: No(wbc 11.9 at admission) Respiratory Status: Room air History of Recent Intubation: No Behavior/Cognition: Alert;Cooperative;Pleasant mood;Confused;Distractible;Requires cueing Oral Cavity Assessment: Within Functional Limits Oral Care Completed by SLP: Recent completion by staff Oral Cavity - Dentition: Adequate natural dentition;Missing dentition(few) Vision: Functional for self-feeding(pt may be having delusions, hallucinations) Self-Feeding Abilities: Able to feed self;Needs set up(min) Patient Positioning: Upright in bed Baseline Vocal Quality: Normal(seemed easily distracted) Volitional Cough: Strong Volitional Swallow: Able to elicit    Oral/Motor/Sensory Function Overall Oral Motor/Sensory Function: Within functional limits   Ice Chips Ice chips: Within functional limits Presentation: Spoon(fed; 2 trials)   Thin Liquid Thin Liquid: Within functional limits Presentation: Cup;Self Fed;Straw(~10+  ozs) Other Comments: coffee, 2 juices    Nectar Thick Nectar Thick Liquid: Not tested   Honey Thick Honey Thick Liquid: Not tested   Puree Puree: Within functional limits Presentation: Self Fed;Spoon(2 trials) Other Comments: did not like oatmeal   Solid     Solid: Within functional limits(grossly) Presentation: Self Fed(bites of muffin, eggs) Other Comments: did not want much       Jerilynn Som, MS, CCC-SLP Watson,Katherine 07/20/2018,10:07 AM

## 2018-07-21 ENCOUNTER — Inpatient Hospital Stay: Payer: Medicare PPO

## 2018-07-21 LAB — HEPATITIS PANEL, ACUTE
HCV Ab: <0.1 s/co ratio (ref 0.0–0.9)
Hep A IgM: NEGATIVE
Hep B C IgM: NEGATIVE
Hepatitis B Surface Ag: NEGATIVE

## 2018-07-21 LAB — HEPATIC FUNCTION PANEL
ALBUMIN: 2.4 g/dL — AB (ref 3.5–5.0)
ALK PHOS: 119 U/L (ref 38–126)
ALT: 20 U/L (ref 0–44)
AST: 84 U/L — ABNORMAL HIGH (ref 15–41)
BILIRUBIN INDIRECT: 0.9 mg/dL (ref 0.3–0.9)
Bilirubin, Direct: 0.8 mg/dL — ABNORMAL HIGH (ref 0.0–0.2)
Total Bilirubin: 1.7 mg/dL — ABNORMAL HIGH (ref 0.3–1.2)
Total Protein: 5.1 g/dL — ABNORMAL LOW (ref 6.5–8.1)

## 2018-07-21 LAB — BASIC METABOLIC PANEL
Anion gap: 8 (ref 5–15)
BUN: 7 mg/dL — ABNORMAL LOW (ref 8–23)
CO2: 25 mmol/L (ref 22–32)
Calcium: 7.6 mg/dL — ABNORMAL LOW (ref 8.9–10.3)
Chloride: 107 mmol/L (ref 98–111)
Creatinine, Ser: 0.4 mg/dL — ABNORMAL LOW (ref 0.44–1.00)
GFR calc Af Amer: >60 mL/min (ref >60)
GFR calc non Af Amer: >60 mL/min (ref >60)
Glucose, Bld: 121 mg/dL — ABNORMAL HIGH (ref 70–99)
POTASSIUM: 3.6 mmol/L (ref 3.5–5.1)
Sodium: 140 mmol/L (ref 135–145)

## 2018-07-21 LAB — PHOSPHORUS: Phosphorus: 1.7 mg/dL — ABNORMAL LOW (ref 2.5–4.6)

## 2018-07-21 LAB — MAGNESIUM: MAGNESIUM: 1.9 mg/dL (ref 1.7–2.4)

## 2018-07-21 MED ORDER — K PHOS MONO-SOD PHOS DI & MONO 155-852-130 MG PO TABS
500.0000 mg | ORAL_TABLET | ORAL | Status: AC
  Filled 2018-07-21 (×4): qty 2

## 2018-07-21 NOTE — Clinical Social Work Note (Signed)
Patient had a visit from APS social worker Arnetha CourserCarol Wright [phone]. CSW spoke with Okey Regalarol regarding patient. APS social worker states that they do not feel patient can return home alone and they are working on other alternatives. CSW will continue to follow for discharge planning.   Ruthe Mannanandace Betta Balla MSW, [address]CSWA [phone]

## 2018-07-21 NOTE — Progress Notes (Signed)
PT Cancellation Note  Patient Details Name: Daisy Long MRN: [id number] DOB: 05-Aug-1941   Cancelled Treatment:    Reason Eval/Treat Not Completed: Patient declined, no reason specified PT chart reviewed and attempted treatment, but pt persistently refused treatment. Pt was lethargic but responding to PT. PT will attempt at a later date.  Renaldo HarrisonStephen Arthor Long, SPT    Renaldo HarrisonStephen [name] 07/21/2018, 12:11 PM

## 2018-07-21 NOTE — Consult Note (Signed)
[facility] Consult   Reason for Consult: Follow-up consult for this 77 year old woman who was brought into the hospital unconscious. Referring Physician: Sudini Patient Identification: KRYSTIANA FORNES MRN:  [id number] Principal Diagnosis: Acute delirium Diagnosis:   Patient Active Problem List   Diagnosis Date Noted  . Protein-calorie malnutrition, severe [E43] 07/20/2018  . Acute delirium [R41.0] 07/20/2018  . Dementia [F03.90] 07/20/2018  . Alcohol abuse [F10.10] 07/20/2018  . Toxic metabolic encephalopathy [B51] 07/19/2018  . Cellulitis of umbilicus [W25.852] 77/82/4235  . 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 Renal failure, acute ([place]) [N17.9] 07/16/2017  . Leukocytosis [D72.829] 07/16/2017  . Hyponatremia [E87.1] 07/16/2017  . Acute urinary retention [R33.8] 07/16/2017  . Acute renal failure (ARF) ([place]) [N17.9] 07/16/2017  . Accidental medication overdose, initial encounter [T50.901A] 07/16/2017  . Advance directive discussed with patient [Z71.89] 03/29/2015  . Hyperlipidemia [E78.5]   . Bladder prolapse, female, acquired [N81.10] 08/01/2013  . Routine general medical examination at a health care facility [Z00.00] 12/20/2012  . Subclinical hypothyroidism [E03.9] 09/01/2010  . Episodic mood disorder ([place]) [F39] 05/01/2010  . Essential hypertension, benign [I10] 05/01/2010    Total Time spent with patient: 30 minutes  Subjective:   ALEKSA COLLINSWORTH is a 77 y.o. female patient admitted with "I guess I am fine".  HPI: Patient seen.  Chart reviewed.  See yesterday's note.  Today I found the patient awake sitting up in bed but if anything she is probably even more impaired.  I asked her several questions about where we were and where she lived and what her current situation was.  The closest she came to getting any of them right was to tell me that we were in [place].  Patient has no idea where she is.  Could not remember anything I said even after a  minute or so.  Affect was smiling and calm.  Seem to be completely unconcerned about her situation.  Despite apparently not understanding anything that is going on she had no questions for me.  Social history: See previous note.  Patient had been living by herself and was brought into the hospital after her daughter became alarmed at not being able to reach her and called authorities.  Substance abuse history: Evidence of recent heavy alcohol abuse  Medical history: Patient is malnourished also had a urinary tract infection  Past Psychiatric History: No known past psychiatric history  Risk to Self:   Risk to Others:   Prior Inpatient Therapy:   Prior Outpatient Therapy:    Past Medical History:  Past Medical History:  Diagnosis Date  . Anxiety   . Hyperlipidemia   . Hypertension   . Thyroid disease     Past Surgical History:  Procedure Laterality Date  . TUBAL LIGATION    . VAGINAL DELIVERY     X3   Family History:  Family History  Problem Relation Age of Onset  . Coronary artery disease Mother   . Coronary artery disease Daughter   . Breast cancer Maternal Aunt   . Breast cancer Maternal Grandmother    Family Psychiatric  History: None available Social History:  Social History   Substance and Sexual Activity  Alcohol Use Yes     Social History   Substance and Sexual Activity  Drug Use No    Social History   Socioeconomic History  . Marital status: Widowed    Spouse name: Not on file  . Number of children: 3  . Years of  education: Not on file  . Highest education level: Not on file  Occupational History  . Occupation: retired as IT trainer  Social Needs  . Financial resource strain: Not on file  . Food insecurity:    Worry: Not on file    Inability: Not on file  . Transportation needs:    Medical: Not on file    Non-medical: Not on file  Tobacco Use  . Smoking status: Current Some Day Smoker    Types: Cigarettes  . Smokeless tobacco: Never  Used  Substance and Sexual Activity  . Alcohol use: Yes  . Drug use: No  . Sexual activity: Not on file  Lifestyle  . Physical activity:    Days per week: Not on file    Minutes per session: Not on file  . Stress: Not on file  Relationships  . Social connections:    Talks on phone: Not on file    Gets together: Not on file    Attends religious service: Not on file    Active member of club or organization: Not on file    Attends meetings of clubs or organizations: Not on file    Relationship status: Not on file  Other Topics Concern  . Not on file  Social History Narrative   Has living will.    Daughter Jeanne Ivan has health care POA.   Would accept resuscitation but would not want prolonged artificial ventilation   Would not want feeding tube if cognitively unaware               Additional Social History:    Allergies:   Allergies  Allergen Reactions  . Levothyroxine Other (See Comments)    Increased blood pressure  . Triamterene-Hctz     Labs:  Results for orders placed or performed during the hospital encounter of 07/19/18 (from the past 48 hour(s))  Lactic acid, plasma     Status: None   Collection Time: 07/19/18  9:21 PM  Result Value Ref Range   Lactic Acid, Venous 1.3 0.5 - 1.9 mmol/L    Comment: Performed at [facility], [address] [place], [place] [address]  Comprehensive metabolic panel     Status: Abnormal   Collection Time: 07/20/18  3:53 AM  Result Value Ref Range   Sodium 141 135 - 145 mmol/L   Potassium 3.4 (L) 3.5 - 5.1 mmol/L   Chloride 106 98 - 111 mmol/L   CO2 26 22 - 32 mmol/L   Glucose, Bld 74 70 - 99 mg/dL   BUN 10 8 - 23 mg/dL   Creatinine, Ser 0.53 0.44 - 1.00 mg/dL   Calcium 7.3 (L) 8.9 - 10.3 mg/dL   Total Protein 5.1 (L) 6.5 - 8.1 g/dL   Albumin 2.0 (L) 3.5 - 5.0 g/dL   AST 90 (H) 15 - 41 U/L   ALT 22 0 - 44 U/L   Alkaline Phosphatase 129 (H) 38 - 126 U/L   Total Bilirubin 2.6 (H) 0.3 - 1.2 mg/dL   GFR calc non  Af Amer >60 >60 mL/min   GFR calc Af Amer >60 >60 mL/min    Comment: (NOTE) The eGFR has been calculated using the CKD EPI equation. This calculation has not been validated in all clinical situations. eGFR's persistently <60 mL/min signify possible Chronic Kidney Disease.    Anion gap 9 5 - 15    Comment: Performed at [facility], [address] [place], [place] [address]  Hepatitis panel,  acute     Status: None   Collection Time: 07/20/18  3:53 AM  Result Value Ref Range   Hepatitis B Surface Ag Negative Negative   HCV Ab <0.1 0.0 - 0.9 s/co ratio    Comment: (NOTE)                                  Negative:     < 0.8                             Indeterminate: 0.8 - 0.9                                  Positive:     > 0.9 The CDC recommends that a positive HCV antibody result be followed up with a HCV Nucleic Acid Amplification test (591638). Performed At: [facility] [place], [place] [id number] Rush Farmer MD SV:[id number]    Hep A IgM Negative Negative   Hep B C IgM Negative Negative  Magnesium     Status: Abnormal   Collection Time: 07/20/18  3:53 AM  Result Value Ref Range   Magnesium 1.6 (L) 1.7 - 2.4 mg/dL    Comment: Performed at [facility], [address] [place], [place] [address]  Phosphorus     Status: None   Collection Time: 07/20/18  3:53 AM  Result Value Ref Range   Phosphorus 2.8 2.5 - 4.6 mg/dL    Comment: Performed at [facility], [address] [place], [place] [address]  Protime-INR     Status: Abnormal   Collection Time: 07/20/18  2:43 PM  Result Value Ref Range   Prothrombin Time 16.5 (H) 11.4 - 15.2 seconds   INR 1.34     Comment: Performed at [facility], [address] [place], [place] [address]  Basic metabolic panel     Status: Abnormal   Collection Time: 07/21/18  3:47 AM  Result Value Ref Range   Sodium 140 135 - 145 mmol/L   Potassium 3.6 3.5 - 5.1 mmol/L    Chloride 107 98 - 111 mmol/L   CO2 25 22 - 32 mmol/L   Glucose, Bld 121 (H) 70 - 99 mg/dL   BUN 7 (L) 8 - 23 mg/dL   Creatinine, Ser 0.40 (L) 0.44 - 1.00 mg/dL   Calcium 7.6 (L) 8.9 - 10.3 mg/dL   GFR calc non Af Amer >60 >60 mL/min   GFR calc Af Amer >60 >60 mL/min    Comment: (NOTE) The eGFR has been calculated using the CKD EPI equation. This calculation has not been validated in all clinical situations. eGFR's persistently <60 mL/min signify possible Chronic Kidney Disease.    Anion gap 8 5 - 15    Comment: Performed at [facility], [address] [place], [place] [address]  Magnesium     Status: None   Collection Time: 07/21/18  3:47 AM  Result Value Ref Range   Magnesium 1.9 1.7 - 2.4 mg/dL    Comment: Performed at [facility], [address] [place], [place] [address]  Phosphorus     Status: Abnormal   Collection Time: 07/21/18  3:47 AM  Result Value Ref Range   Phosphorus 1.7 (L) 2.5 - 4.6 mg/dL    Comment:  Performed at [facility], [address] [place], [place] [address]  Hepatic function panel     Status: Abnormal   Collection Time: 07/21/18  3:47 AM  Result Value Ref Range   Total Protein 5.1 (L) 6.5 - 8.1 g/dL   Albumin 2.4 (L) 3.5 - 5.0 g/dL   AST 84 (H) 15 - 41 U/L   ALT 20 0 - 44 U/L   Alkaline Phosphatase 119 38 - 126 U/L   Total Bilirubin 1.7 (H) 0.3 - 1.2 mg/dL   Bilirubin, Direct 0.8 (H) 0.0 - 0.2 mg/dL   Indirect Bilirubin 0.9 0.3 - 0.9 mg/dL    Comment: Performed at [facility], [address], [place], [place] [address]    Current Facility-Administered Medications  Medication Dose Route Frequency Provider Last Rate Last Dose  . albumin human 25 % solution 25 g  25 g Intravenous Daily Gladstone Lighter, MD 60 mL/hr at 07/21/18 1518 25 g at 07/21/18 1518  . albuterol (PROVENTIL) (2.5 MG/3ML) 0.083% nebulizer solution 2.5 mg  2.5 mg Nebulization Q2H PRN Salary, Montell D, MD      . aspirin EC tablet 81 mg   81 mg Oral Daily Salary, Montell D, MD   81 mg at 07/20/18 1000  . cefTRIAXone (ROCEPHIN) 2 g in sodium chloride 0.9 % 100 mL IVPB  2 g Intravenous Q24H Harrie Foreman, MD   Stopped at 07/21/18 0630  . enoxaparin (LOVENOX) injection 40 mg  40 mg Subcutaneous Q24H Salary, Montell D, MD   40 mg at 07/20/18 2144  . feeding supplement (ENSURE ENLIVE) (ENSURE ENLIVE) liquid 237 mL  237 mL Oral TID BM Gladstone Lighter, MD   237 mL at 07/21/18 1005  . folic acid (FOLVITE) tablet 1 mg  1 mg Oral Daily Salary, Montell D, MD   1 mg at 07/20/18 1000  . haloperidol lactate (HALDOL) injection 0.5 mg  0.5 mg Intravenous Q6H PRN Abshir Paolini T, MD      . HYDROcodone-acetaminophen (NORCO/VICODIN) 5-325 MG per tablet 1-2 tablet  1-2 tablet Oral Q4H PRN Salary, Montell D, MD      . levothyroxine (SYNTHROID, LEVOTHROID) tablet 25 mcg  25 mcg Oral QAC breakfast Salary, Montell D, MD   25 mcg at 07/20/18 1000  . lisinopril (PRINIVIL,ZESTRIL) tablet 20 mg  20 mg Oral Daily Salary, Montell D, MD   20 mg at 07/20/18 1000  . LORazepam (ATIVAN) tablet 1 mg  1 mg Oral Q6H PRN Salary, Avel Peace, MD       Or  . LORazepam (ATIVAN) injection 1 mg  1 mg Intravenous Q6H PRN Salary, Montell D, MD      . multivitamin with minerals tablet 1 tablet  1 tablet Oral Daily Salary, Montell D, MD   1 tablet at 07/20/18 1000  . ondansetron (ZOFRAN) tablet 4 mg  4 mg Oral Q6H PRN Salary, Montell D, MD       Or  . ondansetron (ZOFRAN) injection 4 mg  4 mg Intravenous Q6H PRN Salary, Montell D, MD      . phosphorus (K PHOS NEUTRAL) tablet 500 mg  500 mg Oral Q4H Kalisetti, Radhika, MD      . polyethylene glycol (MIRALAX / GLYCOLAX) packet 17 g  17 g Oral Daily PRN Salary, Montell D, MD      . thiamine (VITAMIN B-1) tablet 100 mg  100 mg Oral Daily Salary, Montell D, MD   100 mg at 07/20/18 1000   Or  .  thiamine (B-1) injection 100 mg  100 mg Intravenous Daily Salary, Montell D, MD        Musculoskeletal: Strength & Muscle Tone:  decreased Gait & Station: unable to stand Patient leans: N/A  Psychiatric Specialty Exam: Physical Exam  Nursing note and vitals reviewed. Constitutional: She appears well-developed.  HENT:  Head: Normocephalic and atraumatic.  Eyes: Pupils are equal, round, and reactive to light. Conjunctivae are normal.  Neck: Normal range of motion.  Cardiovascular: Normal heart sounds.  Respiratory: Effort normal.  GI: Soft.  Musculoskeletal: Normal range of motion.  Neurological: She is alert.  Skin: Skin is warm and dry.  Psychiatric: Her affect is blunt. Her speech is delayed. She is slowed. Cognition and memory are impaired. She expresses inappropriate judgment. She expresses no homicidal and no suicidal ideation. She is noncommunicative. She exhibits abnormal recent memory and abnormal remote memory.    Review of Systems  Unable to perform ROS: Mental status change    Blood pressure 103/72, pulse 72, temperature 98.4 F (36.9 C), resp. rate 18, height 5' 1"  (1.549 m), weight 49.4 kg (109 lb), SpO2 98 %.Body mass index is 20.6 kg/m.  General Appearance: Fairly Groomed  Eye Contact:  Fair  Speech:  Slow  Volume:  Decreased  Mood:  Euthymic  Affect:  Constricted  Thought Process:  Disorganized and Irrelevant  Orientation:  Negative  Thought Content:  Negative  Suicidal Thoughts:  No  Homicidal Thoughts:  No  Memory:  Immediate;   Fair Recent;   Poor Remote;   Poor  Judgement:  Impaired  Insight:  Lacking  Psychomotor Activity:  Decreased  Concentration:  Concentration: Poor  Recall:  Poor  Fund of Knowledge:  Poor  Language:  Poor  Akathisia:  No  Handed:  Right  AIMS (if indicated):     Assets:  Resilience  ADL's:  Impaired  Cognition:  Impaired,  Moderate  Sleep:        Treatment Plan Summary: Plan Patient today very impaired.  Did not to me appear to be delirious but appears like the dementia and forgetfulness has advanced even farther.  I am concerned even more  after seeing her today that she may have developed a permanent course to cause dementia.  No indication right now for any psychiatric medicine.  Patient is not agitated does not require sedatives.  We will continue to follow.  Try to improve strength and nutrition.  Patient will almost certainly require placement given her current condition.  Continues to lack any capacity.  Disposition: Patient does not meet criteria for psychiatric inpatient admission. Supportive therapy provided about ongoing stressors.  Alethia Berthold, MD 07/21/2018 5:58 PM

## 2018-07-21 NOTE — Consult Note (Signed)
PHARMACY CONSULT NOTE   Pharmacy Consult for Electrolyte Monitoring  Indication: High Risk for Refeeding Syndrome  LABS: Potassium (mmol/L)  Date Value  07/21/2018 3.6  03/01/2014 3.8   Magnesium (mg/dL)  Date Value  16/10/960408/12/2017 1.9  03/01/2014 1.6 (L)   Phosphorus (mg/dL)  Date Value  54/09/811908/12/2017 1.7 (L)   Calcium (mg/dL)  Date Value  14/78/295608/12/2017 7.6 (L)   Calcium, Total (mg/dL)  Date Value  21/30/865703/11/2014 8.6   Albumin (g/dL)  Date Value  84/69/629508/12/2017 2.4 (L)  10/30/2013 3.5  ]  Estimated Creatinine Clearance: 44.4 mL/min (A) (by C-G formula based on SCr of 0.4 mg/dL (L)).    Assessment: Pharmacy consulted for electrolyte monitoring and replacement in 77 yo female at risk for refeeding syndrome.   Goal of Therapy:  Electrolytes WNL  Plan:  8/1: K: 3.6, Mg: 1.9, Phos: 1.7, Corrected Ca:8.9  Scr 0.4 Will order K-Phos neutral 2 tabs q4h x 4 doses. Will recheck electrolytes with AM labs and continue to replace as needed.   Angelique BlonderMerrill,Quavon Keisling A, PharmD, BCPS Clinical Pharmacist 07/21/2018 8:28 AM

## 2018-07-21 NOTE — Progress Notes (Signed)
[facility] [place] [facility]   PATIENT NAME: Daisy Long    MR#:  [id number]  DATE OF BIRTH:  [birth year]  SUBJECTIVE:  CHIEF COMPLAINT:   Chief Complaint  Patient presents with  . Altered Mental Status   -More alert today, still has hallucinations and refusing to take oral medications. -Discussed the plan with daughter over the phone today  REVIEW OF SYSTEMS:  Review of Systems  Unable to perform ROS: Mental status change    DRUG ALLERGIES:   Allergies  Allergen Reactions  . Levothyroxine Other (See Comments)    Increased blood pressure  . Triamterene-Hctz     VITALS:  Blood pressure 103/72, pulse 72, temperature 98.4 F (36.9 C), resp. rate 18, height 5\' 1"  (1.549 m), weight 49.4 kg (109 lb), SpO2 98 %.  PHYSICAL EXAMINATION:  Physical Exam  GENERAL:  77 y.o.-year-old ill nourished patient lying in the bed with no acute distress.  EYES: Pupils equal, round, reactive to light and accommodation. No scleral icterus. Extraocular muscles intact.  HEENT: Head atraumatic, normocephalic. Oropharynx and nasopharynx clear.  NECK:  Supple, no jugular venous distention. No thyroid enlargement, no tenderness.  LUNGS: Normal breath sounds bilaterally, no wheezing, rales,rhonchi or crepitation. No use of accessory muscles of respiration.  Decreased bibasilar breath sounds CARDIOVASCULAR: S1, S2 normal. No rubs, or gallops.  2/6 systolic murmur present ABDOMEN: Soft, nontender, nondistended. Bowel sounds present. No organomegaly or mass.  EXTREMITIES: No pedal edema, cyanosis, or clubbing.  NEUROLOGIC: Cranial nerves II through XII are intact. Muscle strength 5/5 in all extremities. Sensation intact. Gait not checked.  Global weakness noted PSYCHIATRIC: The patient is alert and oriented to self, hallucinating.  SKIN: No obvious rash, lesion, or ulcer.    LABORATORY PANEL:   CBC Recent Labs  Lab 07/19/18 1156  WBC 11.9*  HGB 12.1  HCT 35.5  PLT  195   ------------------------------------------------------------------------------------------------------------------  Chemistries  Recent Labs  Lab 07/21/18 0347  NA 140  K 3.6  CL 107  CO2 25  GLUCOSE 121*  BUN 7*  CREATININE 0.40*  CALCIUM 7.6*  MG 1.9  AST 84*  ALT 20  ALKPHOS 119  BILITOT 1.7*   ------------------------------------------------------------------------------------------------------------------  Cardiac Enzymes Recent Labs  Lab 07/19/18 1156  TROPONINI <0.03   ------------------------------------------------------------------------------------------------------------------  RADIOLOGY:  Mr Brain Wo Contrast  Result Date: 07/20/2018 CLINICAL DATA:  Initial evaluation for acute confusion. EXAM: MRI HEAD WITHOUT CONTRAST TECHNIQUE: Multiplanar, multiecho pulse sequences of the brain and surrounding structures were obtained without intravenous contrast. COMPARISON:  Prior CT from 07/19/2018 FINDINGS: Brain: Generalized age-related cerebral atrophy. Patchy T2/FLAIR hyperintensity within the periventricular and deep white matter both cerebral hemispheres most consistent with chronic small vessel ischemic disease, mild in nature. No abnormal foci of restricted diffusion to suggest acute or subacute ischemia. Gray-white matter differentiation maintained. No encephalomalacia to suggest chronic infarction. No evidence for acute or chronic intracranial hemorrhage. No mass lesion, midline shift or mass effect. No hydrocephalus. No extra-axial fluid collection. Pituitary gland within normal limits. Vascular: Major intracranial vascular flow voids maintained Skull and upper cervical spine: Craniocervical junction normal. Upper cervical spine grossly within normal limits. Bone marrow signal intensity normal. No scalp soft tissue abnormality. Sinuses/Orbits: Globes and orbital soft tissues within normal limits. Paranasal sinuses are clear. Trace opacity right mastoid air  cells, of doubtful significance. Inner ear structures normal. Other: None. IMPRESSION: 1. No acute intracranial abnormality. 2. Age-related cerebral atrophy with mild chronic small vessel ischemic disease. Electronically Signed  By: Rise Mu M.D.   On: 07/20/2018 03:14   US Abdomen Complete  Result Date: 07/21/2018 CLINICAL DATA:  Cirrhosis EXAM: ABDOMEN ULTRASOUND COMPLETE COMPARISON:  11/08/2017 FINDINGS: Gallbladder: No gallstones or wall thickening visualized. No sonographic Murphy sign noted by sonographer. Common bile duct: Diameter: 4 mm Liver: Increased echogenicity is noted throughout the liver consistent with fatty infiltration similar to that noted on the prior CT of the chest. Portal vein is patent on color Doppler imaging with normal direction of blood flow towards the liver. IVC: No abnormality visualized. Pancreas: Not well visualized. Spleen: Size and appearance within normal limits. Right Kidney: Length: 10.5 cm. Echogenicity within normal limits. No mass or hydronephrosis visualized. Left Kidney: Length: 10.8 cm. Echogenicity within normal limits. No mass or hydronephrosis visualized. Abdominal aorta: No aneurysm visualized. Other findings: Mild ascites is noted. IMPRESSION: Mild ascites. Increased echogenicity of the liver consistent with fatty infiltration. No significant nodularity is noted. Electronically Signed   By: Alcide Clever M.D.   On: 07/21/2018 09:48   Ct Angio Chest Aorta W/cm &/or Wo/cm  Result Date: 07/19/2018 CLINICAL DATA:  Possible thoracic aneurysm on recent plain film EXAM: CT ANGIOGRAPHY CHEST WITH CONTRAST TECHNIQUE: Multidetector CT imaging of the chest was performed using the standard protocol during bolus administration of intravenous contrast. Multiplanar CT image reconstructions and MIPs were obtained to evaluate the vascular anatomy. CONTRAST:  75mL ISOVUE-370 IOPAMIDOL (ISOVUE-370) INJECTION 76% COMPARISON:  Plain film from earlier in the same day,  CT of the abdomen and pelvis from 11/08/2017. FINDINGS: Cardiovascular: Thoracic aorta demonstrates atherosclerotic calcification and significant tortuosity although the aorta itself is not significantly aneurysmally dilated. Maximum dimension is 3.5 cm. Sino-tubular junction measures 2.8 cm and the sinus of Valsalva 3.7 cm. Descending thoracic aorta demonstrates some hypodensity along the posterior margin of the descending thoracic aorta/proximal abdominal aorta. This was not seen on the prior CT from 11/08/2017. This likely represents soft atherosclerotic plaque or possibly mural thrombus. No evidence of dissection is seen. Pulmonary artery is well visualized. No pulmonary emboli are seen. Mediastinum/Nodes: The esophagus is within normal limits. No mediastinal or hilar adenopathy is seen. The thoracic inlet is within normal limits. Lungs/Pleura: Lungs are well aerated bilaterally. Very mild atelectatic changes are noted in the right middle lobe. Subpleural nodule is noted in the left lateral costophrenic angle measuring approximately 5 mm new from the prior exam. No other significant nodular changes are seen. Upper Abdomen: Fatty infiltration of the liver is noted. No other abdominal abnormality is seen. Musculoskeletal: Degenerative changes of the thoracic spine are noted. No compression deformities are seen. Review of the MIP images confirms the above findings. IMPRESSION: Abnormality on recent plain film represents a tortuous thoracic aorta. No true aneurysmal dilatation is seen. Hypodense mural material is noted along the descending aspect of the thoracic aorta as described. This may represent some soft atherosclerotic plaque or mural thrombus. This was not present on the previous CT examination. Fatty infiltration of the liver. No evidence of pulmonary emboli. 5 mm nodule in the left lower lobe laterally. This was not seen on the prior CT of the abdomen and pelvis. No follow-up needed if patient is low-risk.  Non-contrast chest CT can be considered in 12 months if patient is high-risk. This recommendation follows the consensus statement: Guidelines for Management of Incidental Pulmonary Nodules Detected on CT Images: From the Fleischner Society 2017; Radiology 2017; 284:228-243. Aortic Atherosclerosis (ICD10-I70.0). Electronically Signed   By: Alcide Clever M.D.   On: 07/19/2018  14:27    EKG:   Orders placed or performed during the hospital encounter of 07/19/18  . EKG 12-Lead  . EKG 12-Lead  . ED EKG  . ED EKG    ASSESSMENT AND PLAN:   77 year old female with past medical history significant for alcohol abuse, hypertension, thyroid disease was brought in secondary to altered mental status  1.  Altered mental status-toxic metabolic encephalopathy -Alcohol intoxication and now going through delirium tremens -CT of the head with atrophy and chronic microvascular ischemic changes but no acute abnormality -MRI of the brain with mild atrophy and no acute findings -Ammonia is within normal limits -Psych consult appreciated.  Concern for Wernicke's encephalopathy -Continue to monitor.  Physical therapy  2.  Electrolyte abnormalities-hypokalemia and hypomagnesemia being replaced -Pharmacy has been consulted.  Dietary consult for refeeding syndrome  3.  Lactic acidosis-concern for starvation ketosis -Improving with fluids  4.  Elevated LFTs-likely alcoholic liver cirrhosis.  Ultrasound showing fatty liver-Monitor LFTs  5.  Hypertension- lisinopril  6.  Klebsiella bacteremia- likely source urine and GI.  Patient was laying in her feces when found. -  Urine cultures growing klebsiella.  on Rocephin  7.  DVT prophylaxis-on Lovenox  PT consult when stable Called daughter Mackey Birchwood over the phone and plan to meet this Saturday AM   All the records are reviewed and case discussed with Care Management/Social Workerr. Management plans discussed with the patient, family and they are in  agreement.  CODE STATUS: Full code  TOTAL TIME TAKING CARE OF THIS PATIENT: 37 minutes.   POSSIBLE D/C IN 2-3 DAYS, DEPENDING ON CLINICAL CONDITION.   Nabeeha Badertscher M.D on 07/21/2018 at 1:45 PM  Between 7am to 6pm - Pager - [phone]  After 6pm go to [email or web address] - Social research officer, government  [facility]  Office  [phone]  CC: Primary care physician; Karie Schwalbe, MD

## 2018-07-22 DIAGNOSIS — K7030 Alcoholic cirrhosis of liver without ascites: Secondary | ICD-10-CM

## 2018-07-22 LAB — URINALYSIS, COMPLETE (UACMP) WITH MICROSCOPIC
SPECIFIC GRAVITY, URINE: 1.026 (ref 1.005–1.030)
Squamous Epithelial / LPF: NONE SEEN (ref 0–5)

## 2018-07-22 LAB — CULTURE, BLOOD (ROUTINE X 2): SPECIAL REQUESTS: ADEQUATE

## 2018-07-22 LAB — BASIC METABOLIC PANEL
ANION GAP: 6 (ref 5–15)
BUN: 5 mg/dL — ABNORMAL LOW (ref 8–23)
CHLORIDE: 109 mmol/L (ref 98–111)
CO2: 25 mmol/L (ref 22–32)
Calcium: 7.7 mg/dL — ABNORMAL LOW (ref 8.9–10.3)
Creatinine, Ser: 0.47 mg/dL (ref 0.44–1.00)
GFR calc non Af Amer: >60 mL/min (ref >60)
Glucose, Bld: 85 mg/dL (ref 70–99)
POTASSIUM: 3.4 mmol/L — AB (ref 3.5–5.1)
Sodium: 140 mmol/L (ref 135–145)

## 2018-07-22 LAB — URINE CULTURE: Culture: 10000 — AB

## 2018-07-22 LAB — PHOSPHORUS: Phosphorus: 2 mg/dL — ABNORMAL LOW (ref 2.5–4.6)

## 2018-07-22 LAB — MAGNESIUM: Magnesium: 1.8 mg/dL (ref 1.7–2.4)

## 2018-07-22 MED ORDER — HALOPERIDOL LACTATE 5 MG/ML IJ SOLN
1.0000 mg | INTRAMUSCULAR | Status: AC
  Administered 2018-07-22: 09:00:00 1 mg via INTRAVENOUS
  Filled 2018-07-22: qty 1

## 2018-07-22 MED ORDER — SODIUM CHLORIDE 0.9 % IV SOLN
INTRAVENOUS | Status: AC
  Administered 2018-07-22 – 2018-07-24 (×4): via INTRAVENOUS
  Administered 2018-07-24: 05:00:00 100 mL/h via INTRAVENOUS

## 2018-07-22 MED ORDER — LACTATED RINGERS IV SOLN
INTRAVENOUS | Status: DC
  Administered 2018-07-22: 04:00:00 via INTRAVENOUS

## 2018-07-22 MED ORDER — POTASSIUM PHOSPHATES 15 MMOLE/5ML IV SOLN
20.0000 mmol | Freq: Once | INTRAVENOUS | Status: AC
  Administered 2018-07-22: 20 mmol via INTRAVENOUS
  Filled 2018-07-22: qty 6.67

## 2018-07-22 MED ORDER — CEFAZOLIN SODIUM-DEXTROSE 2-4 GM/100ML-% IV SOLN
2.0000 g | Freq: Three times a day (TID) | INTRAVENOUS | Status: DC
  Administered 2018-07-22 – 2018-07-25 (×8): 2 g via INTRAVENOUS
  Filled 2018-07-22 (×14): qty 100

## 2018-07-22 NOTE — Care Management Important Message (Signed)
Important Message  Patient Details  Name: Daisy Long MRN: [id number] Date of Birth: 1941/06/12   Medicare Important Message Given:  Other (see comment) Per RN CM, Patient had Psych. Evaluation today and not able to make her own decisions.  Daughter driving from [place] and no other family.  Will ask weekend Case Mgr. to follow-up with daughter.   Olegario MessierKathy A Jaslynne Dahan 07/22/2018, 4:21 PM

## 2018-07-22 NOTE — Progress Notes (Signed)
PT Cancellation Note  Patient Details Name: Daisy Long MRN: [id number] DOB: September 05, 1941   Cancelled Treatment:      Treatment attempted but deferred secondary to pt lethargy and lack of alertness--per nursing staff given Haldol recently secondary to pt agitation.  Will reattempt treatment at next medically appropriate date.    Grayland Jackolby Nicole Defino, SPT 07/22/18,12:20 PM

## 2018-07-22 NOTE — Progress Notes (Signed)
Pt has not urinated since 5am on 07/21/18.  She is not eating or drinking.  Multiple bladder scans over the last 2 shifts has only resulted in a total of 446 as the highest this am.  Upon speaking with the MD, it was decided that she would get a 250ml infusion of Lactated Ringers over one hour.  He also put an order in for an in and out cath, however, he stated that he would leave it to my discretion when and if to do it.  Being conservative and letting the patient go on her own would be best, but we will assess her after her fluids and make the determination.

## 2018-07-22 NOTE — Clinical Social Work Note (Signed)
CSW spoke with patient's daughter Robby Sermonaulette Arsenault [phone]. She is on her way her from [place] and will be meeting with the MD tomorrow to discuss discharge plan. Daughter states that she is planning to move back here to care for her mother. She plans to take her mother with her until she can move permanently. CSW signing off. Please re consult if further needs arise.   Ruthe Mannanandace Shimon Trowbridge MSW, [address]CSWA [phone]

## 2018-07-22 NOTE — Consult Note (Signed)
PHARMACY CONSULT NOTE   Pharmacy Consult for Electrolyte Monitoring  Indication: High Risk for Refeeding Syndrome  LABS: Potassium (mmol/L)  Date Value  07/22/2018 3.4 (L)  03/01/2014 3.8   Magnesium (mg/dL)  Date Value  78/29/562108/01/2018 1.8  03/01/2014 1.6 (L)   Phosphorus (mg/dL)  Date Value  30/86/578408/01/2018 2.0 (L)   Calcium (mg/dL)  Date Value  69/62/952808/01/2018 7.7 (L)   Calcium, Total (mg/dL)  Date Value  41/32/440103/11/2014 8.6   Albumin (g/dL)  Date Value  02/72/536608/12/2017 2.4 (L)  10/30/2013 3.5  ]  Estimated Creatinine Clearance: 44.4 mL/min (by C-G formula based on SCr of 0.47 mg/dL).    Assessment: Pharmacy consulted for electrolyte monitoring and replacement in 77 yo female at risk for refeeding syndrome.   Goal of Therapy:  Electrolytes WNL  Plan:  8/1: K: 3.6, Mg: 1.9, Phos: 1.7, Corrected Ca:8.9  Scr 0.4 Will order K-Phos neutral 2 tabs q4h x 4 doses. Will recheck electrolytes with AM labs and continue to replace as needed.   8/2- K 3.4, mag 1.8, Phos 2.0, Scr 0.47 No KPhos doses were charted as given yesterday, patient was refusing PO medications. Will order Potassium Phosphate 20 mmol IV x 1. (This will give ~ 26 meq of K+).  F/u with am labs.  Angelique BlonderMerrill,Loni Abdon A, PharmD, BCPS Clinical Pharmacist 07/22/2018 8:33 AM

## 2018-07-22 NOTE — Progress Notes (Signed)
[facility] - [place] at [facility]   PATIENT NAME: Daisy Long    MR#:  [id number]  DATE OF BIRTH:  10/05/1941  SUBJECTIVE:  CHIEF COMPLAINT:   Chief Complaint  Patient presents with  . Altered Mental Status   - confused more today, agitated, urinary retention noted.  - daughter coming from [place] tomorrow  REVIEW OF SYSTEMS:  Review of Systems  Unable to perform ROS: Mental status change    DRUG ALLERGIES:   Allergies  Allergen Reactions  . Levothyroxine Other (See Comments)    Increased blood pressure  . Triamterene-Hctz     VITALS:  Blood pressure 119/70, pulse 78, temperature 98.4 F (36.9 C), temperature source Oral, resp. rate 17, height 5\' 1"  (1.549 m), weight 49.4 kg (109 lb), SpO2 97 %.  PHYSICAL EXAMINATION:  Physical Exam  GENERAL:  77 y.o.-year-old ill nourished patient lying in the bed with no acute distress.  EYES: Pupils equal, round, reactive to light and accommodation. No scleral icterus. Extraocular muscles intact.  HEENT: Head atraumatic, normocephalic. Oropharynx and nasopharynx clear.  NECK:  Supple, no jugular venous distention. No thyroid enlargement, no tenderness.  LUNGS: Normal breath sounds bilaterally, no wheezing, rales,rhonchi or crepitation. No use of accessory muscles of respiration.  Decreased bibasilar breath sounds CARDIOVASCULAR: S1, S2 normal. No rubs, or gallops.  2/6 systolic murmur present ABDOMEN: Soft, nontender, nondistended. Bowel sounds present. No organomegaly or mass.  EXTREMITIES: No pedal edema, cyanosis, or clubbing.  NEUROLOGIC: Cranial nerves II through XII are intact. Muscle strength 5/5 in all extremities. Confused and not following commands Sensation intact. PSYCHIATRIC: The patient is alert and oriented to self, hallucinating.  SKIN: No obvious rash, lesion, or ulcer.    LABORATORY PANEL:   CBC Recent Labs  Lab 07/19/18 1156  WBC 11.9*  HGB 12.1  HCT 35.5  PLT 195    ------------------------------------------------------------------------------------------------------------------  Chemistries  Recent Labs  Lab 07/21/18 0347 07/22/18 0523  NA 140 140  K 3.6 3.4*  CL 107 109  CO2 25 25  GLUCOSE 121* 85  BUN 7* 5*  CREATININE 0.40* 0.47  CALCIUM 7.6* 7.7*  MG 1.9 1.8  AST 84*  --   ALT 20  --   ALKPHOS 119  --   BILITOT 1.7*  --    ------------------------------------------------------------------------------------------------------------------  Cardiac Enzymes Recent Labs  Lab 07/19/18 1156  TROPONINI <0.03   ------------------------------------------------------------------------------------------------------------------  RADIOLOGY:  US Abdomen Complete  Result Date: 07/21/2018 CLINICAL DATA:  Cirrhosis EXAM: ABDOMEN ULTRASOUND COMPLETE COMPARISON:  11/08/2017 FINDINGS: Gallbladder: No gallstones or wall thickening visualized. No sonographic Murphy sign noted by sonographer. Common bile duct: Diameter: 4 mm Liver: Increased echogenicity is noted throughout the liver consistent with fatty infiltration similar to that noted on the prior CT of the chest. Portal vein is patent on color Doppler imaging with normal direction of blood flow towards the liver. IVC: No abnormality visualized. Pancreas: Not well visualized. Spleen: Size and appearance within normal limits. Right Kidney: Length: 10.5 cm. Echogenicity within normal limits. No mass or hydronephrosis visualized. Left Kidney: Length: 10.8 cm. Echogenicity within normal limits. No mass or hydronephrosis visualized. Abdominal aorta: No aneurysm visualized. Other findings: Mild ascites is noted. IMPRESSION: Mild ascites. Increased echogenicity of the liver consistent with fatty infiltration. No significant nodularity is noted. Electronically Signed   By: Alcide Clever M.D.   On: 07/21/2018 09:48    EKG:   Orders placed or performed during the hospital encounter of 07/19/18  . EKG 12-Lead   .  EKG 12-Lead  . ED EKG  . ED EKG    ASSESSMENT AND PLAN:   77 year old female with past medical history significant for alcohol abuse, hypertension, thyroid disease was brought in secondary to altered mental status  1.  Altered mental status-toxic metabolic encephalopathy - also underlying dementia -Alcohol intoxication and  With delirium tremens -CT of the head with atrophy and chronic microvascular ischemic changes but no acute abnormality -MRI of the brain with mild atrophy and no acute findings -Ammonia is within normal limits -Psych consult appreciated.  Concern for Wernicke's encephalopathy -Continue to monitor.  Physical therapy when able to -Appreciate GI consult.  No indication  for steroids as LFTs are improving  2.  Electrolyte abnormalities-hypokalemia and hypomagnesemia being replaced -Pharmacy has been consulted.  Dietary consult for refeeding syndrome  3.  Lactic acidosis-concern for starvation ketosis -Improved with fluids  4.  Elevated LFTs-likely alcoholic liver cirrhosis.  Ultrasound showing fatty liver-Monitor LFTs  5.  Hypertension- lisinopril  6.  Klebsiella bacteremia- likely source urine and GI.  Patient was laying in her feces when found. -  Urine cultures growing klebsiella.  on Rocephin- narrow down to Ancef based on sensitivities  7.  DVT prophylaxis-on Lovenox  PT consult when stable Called daughter Mackey Birchwoodaulette over the phone yesterday and plan to meet this Saturday AM   All the records are reviewed and case discussed with Care Management/Social Workerr. Management plans discussed with the patient, family and they are in agreement.  CODE STATUS: Full code  TOTAL TIME TAKING CARE OF THIS PATIENT: 38 minutes.   POSSIBLE D/C IN 2-3 DAYS, DEPENDING ON CLINICAL CONDITION.   Harout Scheurich M.D on 07/22/2018 at 12:41 PM  Between 7am to 6pm - Pager - [phone]  After 6pm go to [email or web address] - Social research officer, governmentpassword EPAS [facility]  [facility]  Office  [phone]  CC: Primary care physician; Karie SchwalbeLetvak, Richard I, MD

## 2018-07-22 NOTE — Progress Notes (Addendum)
Wyline Mood , MD [address], [address], [place], [place], [address] [address], [place], [place], [address] Phone: [phone]  Fax: [phone]   Daisy Long is being followed for alcoholic hepatitis Subjective: Drowsy , says she is doing fine, has mittens on, not very alert.   Objective: Vital signs in last 24 hours: Vitals:   07/21/18 1919 07/21/18 2209 07/22/18 0445 07/22/18 0539  BP: 117/63 124/82 117/67 119/70  Pulse: 79 86 76 78  Resp: 19 20 17    Temp: 98.3 F (36.8 C) 98.4 F (36.9 C)    TempSrc:  Oral    SpO2: 95% 96% 97%   Weight:      Height:       Weight change:   Intake/Output Summary (Last 24 hours) at 07/22/2018 1036 Last data filed at 07/22/2018 1026 Gross per 24 hour  Intake 90 ml  Output 525 ml  Net -435 ml     Exam: Heart:: Regular rate and rhythm, S1S2 present or without murmur or extra heart sounds Lungs: normal, clear to auscultation and clear to auscultation and percussion Abdomen: soft, nontender, normal bowel sounds   Lab Results: @LABTEST2 @ Micro Results: Recent Results (from the past 240 hour(s))  Culture, blood (routine x 2)     Status: Abnormal   Collection Time: 07/19/18 11:56 AM  Result Value Ref Range Status   Specimen Description   Final    BLOOD RIGHT ANTECUBITAL Performed at [facility], [address], [place], [place] [address]    Special Requests   Final    BOTTLES DRAWN AEROBIC AND ANAEROBIC Blood Culture adequate volume Performed at [facility], [address] [place], [place] [address]    Culture  Setup Time   Final    GRAM NEGATIVE RODS IN BOTH AEROBIC AND ANAEROBIC BOTTLES CRITICAL RESULT CALLED TO, READ BACK BY AND VERIFIED WITH: DAVID BESANTI AT 3244 07/20/18 SDR Performed at [facility], [address], [place], [place] [address]    Culture KLEBSIELLA PNEUMONIAE (A)  Final   Report Status 07/22/2018 FINAL  Final   Organism ID, Bacteria KLEBSIELLA PNEUMONIAE  Final       Susceptibility   Klebsiella pneumoniae - MIC*    AMPICILLIN RESISTANT Resistant     CEFAZOLIN <=4 SENSITIVE Sensitive     CEFEPIME <=1 SENSITIVE Sensitive     CEFTAZIDIME <=1 SENSITIVE Sensitive     CEFTRIAXONE <=1 SENSITIVE Sensitive     CIPROFLOXACIN <=0.25 SENSITIVE Sensitive     GENTAMICIN <=1 SENSITIVE Sensitive     IMIPENEM <=0.25 SENSITIVE Sensitive     TRIMETH/SULFA <=20 SENSITIVE Sensitive     AMPICILLIN/SULBACTAM 4 SENSITIVE Sensitive     PIP/TAZO <=4 SENSITIVE Sensitive     Extended ESBL NEGATIVE Sensitive     * KLEBSIELLA PNEUMONIAE  Blood Culture ID Panel (Reflexed)     Status: Abnormal   Collection Time: 07/19/18 11:56 AM  Result Value Ref Range Status   Enterococcus species NOT DETECTED NOT DETECTED Final   Listeria monocytogenes NOT DETECTED NOT DETECTED Final   Staphylococcus species NOT DETECTED NOT DETECTED Final   Staphylococcus aureus NOT DETECTED NOT DETECTED Final   Streptococcus species NOT DETECTED NOT DETECTED Final   Streptococcus agalactiae NOT DETECTED NOT DETECTED Final   Streptococcus pneumoniae NOT DETECTED NOT DETECTED Final   Streptococcus pyogenes NOT DETECTED NOT DETECTED Final   Acinetobacter baumannii NOT DETECTED NOT DETECTED Final   Enterobacteriaceae species DETECTED (A) NOT DETECTED  Final    Comment: Enterobacteriaceae represent a large family of gram-negative bacteria, not a single organism. CRITICAL RESULT CALLED TO, READ BACK BY AND VERIFIED WITH: DAVID BESANTI AT 0514 07/20/18 SDR    Enterobacter cloacae complex NOT DETECTED NOT DETECTED Final   Escherichia coli NOT DETECTED NOT DETECTED Final   Klebsiella oxytoca NOT DETECTED NOT DETECTED Final   Klebsiella pneumoniae DETECTED (A) NOT DETECTED Final    Comment: CRITICAL RESULT CALLED TO, READ BACK BY AND VERIFIED WITH:  DAVID BESANTI AT 0517 07/20/18 SDR    Proteus species NOT DETECTED NOT DETECTED Final   Serratia marcescens NOT DETECTED NOT DETECTED Final   Carbapenem  resistance NOT DETECTED NOT DETECTED Final   Haemophilus influenzae NOT DETECTED NOT DETECTED Final   Neisseria meningitidis NOT DETECTED NOT DETECTED Final   Pseudomonas aeruginosa NOT DETECTED NOT DETECTED Final   Candida albicans NOT DETECTED NOT DETECTED Final   Candida glabrata NOT DETECTED NOT DETECTED Final   Candida krusei NOT DETECTED NOT DETECTED Final   Candida parapsilosis NOT DETECTED NOT DETECTED Final   Candida tropicalis NOT DETECTED NOT DETECTED Final    Comment: Performed at [facility], [address] [place], [place] [address]  Urine culture     Status: Abnormal   Collection Time: 07/19/18  4:26 PM  Result Value Ref Range Status   Specimen Description   Final    URINE, RANDOM Performed at [facility], [address] [place], [place] [address]    Special Requests   Final    NONE Performed at [facility], [address] [place], [place] [address]    Culture 10,000 COLONIES/mL KLEBSIELLA PNEUMONIAE (A)  Final   Report Status 07/22/2018 FINAL  Final   Organism ID, Bacteria KLEBSIELLA PNEUMONIAE (A)  Final      Susceptibility   Klebsiella pneumoniae - MIC*    AMPICILLIN RESISTANT Resistant     CEFAZOLIN <=4 SENSITIVE Sensitive     CEFTRIAXONE <=1 SENSITIVE Sensitive     CIPROFLOXACIN <=0.25 SENSITIVE Sensitive     GENTAMICIN <=1 SENSITIVE Sensitive     IMIPENEM <=0.25 SENSITIVE Sensitive     NITROFURANTOIN 32 SENSITIVE Sensitive     TRIMETH/SULFA <=20 SENSITIVE Sensitive     AMPICILLIN/SULBACTAM 4 SENSITIVE Sensitive     PIP/TAZO <=4 SENSITIVE Sensitive     Extended ESBL NEGATIVE Sensitive     * 10,000 COLONIES/mL KLEBSIELLA PNEUMONIAE  Culture, blood (routine x 2)     Status: None (Preliminary result)   Collection Time: 07/19/18  5:01 PM  Result Value Ref Range Status   Specimen Description BLOOD RIGHT ANTECUBITAL  Final   Special Requests   Final    Blood Culture results may not be optimal due to an inadequate volume of  blood received in culture bottles   Culture   Final    NO GROWTH 3 DAYS Performed at [facility], [address] [place], [place] [address]    Report Status PENDING  Incomplete   Studies/Results: Koreas Abdomen Complete  Result Date: 07/21/2018 CLINICAL DATA:  Cirrhosis EXAM: ABDOMEN ULTRASOUND COMPLETE COMPARISON:  11/08/2017 FINDINGS: Gallbladder: No gallstones or wall thickening visualized. No sonographic Murphy sign noted by sonographer. Common bile duct: Diameter: 4 mm Liver: Increased echogenicity is noted throughout the liver consistent with fatty infiltration similar to that noted on the prior CT of the chest. Portal vein is patent on color Doppler imaging with normal direction of blood flow towards the liver. IVC: No abnormality  visualized. Pancreas: Not well visualized. Spleen: Size and appearance within normal limits. Right Kidney: Length: 10.5 cm. Echogenicity within normal limits. No mass or hydronephrosis visualized. Left Kidney: Length: 10.8 cm. Echogenicity within normal limits. No mass or hydronephrosis visualized. Abdominal aorta: No aneurysm visualized. Other findings: Mild ascites is noted. IMPRESSION: Mild ascites. Increased echogenicity of the liver consistent with fatty infiltration. No significant nodularity is noted. Electronically Signed   By: Alcide Clever M.D.   On: 07/21/2018 09:48   Medications: I have reviewed the patient's current medications. Scheduled Meds: . aspirin EC  81 mg Oral Daily  . enoxaparin (LOVENOX) injection  40 mg Subcutaneous Q24H  . feeding supplement (ENSURE ENLIVE)  237 mL Oral TID BM  . folic acid  1 mg Oral Daily  . levothyroxine  25 mcg Oral QAC breakfast  . lisinopril  20 mg Oral Daily  . multivitamin with minerals  1 tablet Oral Daily  . thiamine  100 mg Oral Daily   Or  . thiamine  100 mg Intravenous Daily   Continuous Infusions: . albumin human Stopped (07/21/18 2200)  . cefTRIAXone (ROCEPHIN)  IV Stopped (07/22/18 0539)  .  potassium PHOSPHATE IVPB (in mmol) 20 mmol (07/22/18 0911)   PRN Meds:.albuterol, haloperidol lactate, HYDROcodone-acetaminophen, LORazepam **OR** LORazepam, ondansetron **OR** ondansetron (ZOFRAN) IV, polyethylene glycol   Assessment: Principal Problem:   Acute delirium Active Problems:   Toxic metabolic encephalopathy   Protein-calorie malnutrition, severe   Dementia   Alcohol abuse  Daisy Long 77 y.o. female admitted on 07/19/2018 with acute alcohol intoxication transaminitis suspicious for alcoholic hepatitis.  She was also encephalopathic.  Ultrasound abdomen shows only mild ascites.  MRI brain shows no acute abnormalities.  Bilirubin is coming down.  Plan: 1.  Strongly suggest to stop all alcohol. 2.  Good nutrition is key to recovery from alcoholic hepatitis.  He does not meet criteria for steroids and in addition his bilirubin is coming down.  He needs to stop all alcohol, consumption completely otherwise he has a high mortality chance.. 3.  Low-salt diet.  Continue folic acid supplement.  If encephalopathy can consider lactulose titrated to 2 soft bowel movements per day avoid diarrhea as dehydration and lower lip colitis can further worsen encephalopathy.  Suggest follow-up with Dr. Norma Fredrickson as an outpatient in his clinic.  I will sign off.  Please call me if any further GI concerns or questions.  We would like to thank you for the opportunity to participate in the care of Daisy Long.    LOS: 3 days   Wyline Mood, MD 07/22/2018, 10:36 AM

## 2018-07-22 NOTE — Progress Notes (Addendum)
Pt was bladder scan and was at 480ml. Pt was in and out cath and tolerated well at 2350 and removed a 550 ml of urine. Will continue to monitor.  Update 0000: Pt refused  her neuro checks and CIWA. Will continue to monitor.  Update 0627: Pt was bladder scan and showed 229. Will continue to monitor.

## 2018-07-22 NOTE — Plan of Care (Signed)
?  Problem: Clinical Measurements: ?Goal: Diagnostic test results will improve ?Outcome: Progressing ?  ?Problem: Safety: ?Goal: Ability to remain free from injury will improve ?Outcome: Progressing ?  ?

## 2018-07-22 NOTE — Progress Notes (Signed)
Bladder scan this am is 429 (after 3 attempts).  We will wait until patient is more alert and try to get her up to the bedside commode before an In and Out is done, and see if she will go on her own.

## 2018-07-22 NOTE — Consult Note (Signed)
  Psychiatry: Came by to see patient today but found her asleep.  Chart reviewed.  Unlikely to be any benefit from waking the patient up given recent confusion.  Chart notes show that the patient continues to be very confused.  No indication for specific medication treatment.  Looks like discharge planning is underway.  I will sign off for now please reconsult if needed.

## 2018-07-23 LAB — COMPREHENSIVE METABOLIC PANEL
ALBUMIN: 2.6 g/dL — AB (ref 3.5–5.0)
ALT: 19 U/L (ref 0–44)
AST: 101 U/L — AB (ref 15–41)
Alkaline Phosphatase: 99 U/L (ref 38–126)
Anion gap: 6 (ref 5–15)
CHLORIDE: 111 mmol/L (ref 98–111)
CO2: 25 mmol/L (ref 22–32)
Calcium: 7.7 mg/dL — ABNORMAL LOW (ref 8.9–10.3)
Creatinine, Ser: 0.36 mg/dL — ABNORMAL LOW (ref 0.44–1.00)
GFR calc Af Amer: >60 mL/min (ref >60)
Glucose, Bld: 89 mg/dL (ref 70–99)
POTASSIUM: 3.4 mmol/L — AB (ref 3.5–5.1)
Sodium: 142 mmol/L (ref 135–145)
Total Bilirubin: 1.2 mg/dL (ref 0.3–1.2)
Total Protein: 5.2 g/dL — ABNORMAL LOW (ref 6.5–8.1)

## 2018-07-23 LAB — PHOSPHORUS: PHOSPHORUS: 3 mg/dL (ref 2.5–4.6)

## 2018-07-23 LAB — MAGNESIUM: MAGNESIUM: 1.7 mg/dL (ref 1.7–2.4)

## 2018-07-23 MED ORDER — TAMSULOSIN HCL 0.4 MG PO CAPS
0.4000 mg | ORAL_CAPSULE | Freq: Every day | ORAL | Status: DC
  Administered 2018-07-23: 10:00:00 0.4 mg via ORAL
  Filled 2018-07-23 (×2): qty 1

## 2018-07-23 MED ORDER — POTASSIUM CHLORIDE 10 MEQ/100ML IV SOLN
10.0000 meq | INTRAVENOUS | Status: AC
Start: 2018-07-23 — End: 2018-07-23
  Administered 2018-07-23 (×3): 10 meq via INTRAVENOUS
  Filled 2018-07-23 (×2): qty 100

## 2018-07-23 NOTE — Progress Notes (Signed)
   [facility] [place] [facility]   Advance care planning  Hospital Day: 4 days Roselyn MeierJanice Mancillas is a 77 y.o. female presenting with Altered Mental Status .   Advance care planning discussed with patient's daughter Ms. Paulette at bedside. All questions in regards to overall condition and expected prognosis answered.  Patient has 4 children, 1 of her son died a few years ago.  2 other children are not in touch with her, they live in [place].  Due to patient's encephalopathy, she is unable to make her own decisions at this time.  But according to daughter, patient does have a living will indicating that she did not want her life to be along to.  Discussed CODE STATUS, daughter agreed that patient would want to be a DNR.   Further discussion about possible EEG on Monday, if no improvement-daughter considering hospice and palliative care consult.  CODE STATUS: DNR Time spent: 18 minutes

## 2018-07-23 NOTE — Plan of Care (Signed)
  Problem: Education: Goal: Knowledge of General Education information will improve Description Including pain rating scale, medication(s)/side effects and non-pharmacologic comfort measures Outcome: Progressing   

## 2018-07-23 NOTE — Progress Notes (Signed)
[facility] [place] [facility]   PATIENT NAME: Daisy MeierJanice Long    MR#:  [id number]  DATE OF BIRTH:  Jul 17, 1941  SUBJECTIVE:  CHIEF COMPLAINT:   Chief Complaint  Patient presents with  . Altered Mental Status   -About the same as yesterday.  Confused, not allowing care.  Minimal oral intake.  REVIEW OF SYSTEMS:  Review of Systems  Unable to perform ROS: Mental status change    DRUG ALLERGIES:   Allergies  Allergen Reactions  . Levothyroxine Other (See Comments)    Increased blood pressure  . Triamterene-Hctz     VITALS:  Blood pressure 127/72, pulse 74, temperature 98.3 F (36.8 C), resp. rate 19, height 5\' 1"  (1.549 m), weight 49.4 kg (109 lb), SpO2 96 %.  PHYSICAL EXAMINATION:  Physical Exam  GENERAL:  77 y.o.-year-old ill nourished patient lying in the bed with no acute distress.  EYES: Pupils equal, round, reactive to light and accommodation. No scleral icterus. Extraocular muscles intact.  HEENT: Head atraumatic, normocephalic. Oropharynx and nasopharynx clear.  NECK:  Supple, no jugular venous distention. No thyroid enlargement, no tenderness.  LUNGS: Normal breath sounds bilaterally, no wheezing, rales,rhonchi or crepitation. No use of accessory muscles of respiration.  Decreased bibasilar breath sounds CARDIOVASCULAR: S1, S2 normal. No rubs, or gallops.  2/6 systolic murmur present ABDOMEN: Soft, nontender, nondistended. Bowel sounds present. No organomegaly or mass.  EXTREMITIES: No pedal edema, cyanosis, or clubbing.  NEUROLOGIC: Cranial nerves II through XII are intact. Muscle strength 5/5 in all extremities. Confused and not following commands Sensation intact. PSYCHIATRIC: The patient is alert and oriented to self, not very cooperative for care.  SKIN: No obvious rash, lesion, or ulcer.    LABORATORY PANEL:   CBC Recent Labs  Lab 07/19/18 1156  WBC 11.9*  HGB 12.1  HCT 35.5  PLT 195    ------------------------------------------------------------------------------------------------------------------  Chemistries  Recent Labs  Lab 07/23/18 0357  NA 142  K 3.4*  CL 111  CO2 25  GLUCOSE 89  BUN <5*  CREATININE 0.36*  CALCIUM 7.7*  MG 1.7  AST 101*  ALT 19  ALKPHOS 99  BILITOT 1.2   ------------------------------------------------------------------------------------------------------------------  Cardiac Enzymes Recent Labs  Lab 07/19/18 1156  TROPONINI <0.03   ------------------------------------------------------------------------------------------------------------------  RADIOLOGY:  No results found.  EKG:   Orders placed or performed during the hospital encounter of 07/19/18  . EKG 12-Lead  . EKG 12-Lead  . ED EKG  . ED EKG    ASSESSMENT AND PLAN:   77 year old female with past medical history significant for alcohol abuse, hypertension, thyroid disease was brought in secondary to altered mental status  1.  Altered mental status-toxic metabolic encephalopathy - also underlying dementia -Alcohol intoxication and  With delirium tremens -CT of the head with atrophy and chronic microvascular ischemic changes but no acute abnormality -MRI of the brain with mild atrophy and no acute findings -Ammonia is within normal limits -Psych consult appreciated.  Concern for Wernicke's encephalopathy -Continue to monitor.  Physical therapy when able to -Appreciate GI consult.  No indication  for steroids as LFTs are improving  2.  Electrolyte abnormalities-hypokalemia and hypomagnesemia being replaced -Pharmacy has been consulted.  Dietary consult for refeeding syndrome  3.  Acute urinary retention-started on Flomax.  Twice in and out catheterized.  Repeat retention, will insert Foley catheter  4.  Elevated LFTs-likely alcoholic liver cirrhosis.  Ultrasound showing fatty liver-Monitor LFTs  5.  Hypertension- lisinopril  6.  Klebsiella bacteremia-  likely source urine and GI.  Patient was laying in her feces when found. -  Urine cultures growing klebsiella.  Currently on Ancef  7.  DVT prophylaxis-on Lovenox  PT consult when stable Called daughter Mackey Birchwood over the phone and she will driving into town today    All the records are reviewed and case discussed with Care Management/Social Workerr. Management plans discussed with the patient, family and they are in agreement.  CODE STATUS: Full code  TOTAL TIME TAKING CARE OF THIS PATIENT: 36  minutes.   POSSIBLE D/C IN 2-3 DAYS, DEPENDING ON CLINICAL CONDITION.   Myrel Rappleye M.D on 07/23/2018 at 10:47 AM  Between 7am to 6pm - Pager - [phone]  After 6pm go to [email or web address] - Social research officer, government  [facility]  Office  [phone]  CC: Primary care physician; Karie Schwalbe, MD

## 2018-07-23 NOTE — Consult Note (Signed)
PHARMACY CONSULT NOTE   Pharmacy Consult for Electrolyte Monitoring  Indication: High Risk for Refeeding Syndrome  LABS: Potassium (mmol/L)  Date Value  07/23/2018 3.4 (L)  03/01/2014 3.8   Magnesium (mg/dL)  Date Value  16/10/960408/02/2018 1.7  03/01/2014 1.6 (L)   Phosphorus (mg/dL)  Date Value  54/09/811908/02/2018 3.0   Calcium (mg/dL)  Date Value  14/78/295608/02/2018 7.7 (L)   Calcium, Total (mg/dL)  Date Value  21/30/865703/11/2014 8.6   Albumin (g/dL)  Date Value  84/69/629508/02/2018 2.6 (L)  10/30/2013 3.5  ]  Estimated Creatinine Clearance: 44.4 mL/min (A) (by C-G formula based on SCr of 0.36 mg/dL (L)).    Assessment: Pharmacy consulted for electrolyte monitoring and replacement in 77 yo female at risk for refeeding syndrome.   Goal of Therapy:  Electrolytes WNL  Plan:  8/1: K: 3.6, Mg: 1.9, Phos: 1.7, Corrected Ca:8.9  Scr 0.4 Will order K-Phos neutral 2 tabs q4h x 4 doses. Will recheck electrolytes with AM labs and continue to replace as needed.   8/2- K 3.4, mag 1.8, Phos 2.0, Scr 0.47 No KPhos doses were charted as given yesterday, patient was refusing PO medications. Will order Potassium Phosphate 20 mmol IV x 1. (This will give ~ 26 meq of K+).  F/u with am labs.  07/23/18 03:57 K 3.4, Mg 1.7, phos 3, Ca 7.7, adjusted Ca 8.8. Patient received 20 mmol KPhos yesterday afternoon. Patient is currently on aspiration precautions/thins but oral meds have been held. Will give KCl 10 mEq IV Q2H x 3 doses and recheck electrolytes tomorrow with AM labs.   Carola FrostNathan A Tomasina Keasling, PharmD, BCPS Clinical Pharmacist 07/23/2018 7:16 AM

## 2018-07-23 NOTE — Progress Notes (Signed)
Pt still unable to void. Bladder scan shows 413 mL. Verbal order from MD to insert urinary catheter. Foley catheter inserted. Pt tolerated well.

## 2018-07-24 LAB — CBC
HCT: 26.8 % — ABNORMAL LOW (ref 35.0–47.0)
Hemoglobin: 9.4 g/dL — ABNORMAL LOW (ref 12.0–16.0)
MCH: 40.3 pg — ABNORMAL HIGH (ref 26.0–34.0)
MCHC: 35.2 g/dL (ref 32.0–36.0)
MCV: 114.4 fL — AB (ref 80.0–100.0)
PLATELETS: 155 10*3/uL (ref 150–440)
RBC: 2.34 MIL/uL — AB (ref 3.80–5.20)
RDW: 13.5 % (ref 11.5–14.5)
WBC: 10.2 10*3/uL (ref 3.6–11.0)

## 2018-07-24 LAB — MAGNESIUM: MAGNESIUM: 1.5 mg/dL — AB (ref 1.7–2.4)

## 2018-07-24 LAB — BASIC METABOLIC PANEL
ANION GAP: 5 (ref 5–15)
BUN: <5 mg/dL — ABNORMAL LOW (ref 8–23)
CALCIUM: 7.2 mg/dL — AB (ref 8.9–10.3)
CO2: 22 mmol/L (ref 22–32)
Chloride: 113 mmol/L — ABNORMAL HIGH (ref 98–111)
Creatinine, Ser: 0.39 mg/dL — ABNORMAL LOW (ref 0.44–1.00)
GLUCOSE: 83 mg/dL (ref 70–99)
Potassium: 3 mmol/L — ABNORMAL LOW (ref 3.5–5.1)
SODIUM: 140 mmol/L (ref 135–145)

## 2018-07-24 LAB — CULTURE, BLOOD (ROUTINE X 2): Culture: NO GROWTH

## 2018-07-24 LAB — PHOSPHORUS: Phosphorus: 2.7 mg/dL (ref 2.5–4.6)

## 2018-07-24 MED ORDER — POTASSIUM CHLORIDE 20 MEQ PO PACK: 40.0000 meq | PACK | ORAL | Status: DC

## 2018-07-24 MED ORDER — HALOPERIDOL LACTATE 5 MG/ML IJ SOLN: 1.0000 mg | Freq: Four times a day (QID) | INTRAMUSCULAR | Status: DC | PRN

## 2018-07-24 MED ORDER — MAGNESIUM SULFATE 2 GM/50ML IV SOLN
2.0000 g | Freq: Once | INTRAVENOUS | Status: AC
  Administered 2018-07-24: 2 g via INTRAVENOUS
  Filled 2018-07-24: qty 50

## 2018-07-24 MED ORDER — POTASSIUM CHLORIDE 10 MEQ/100ML IV SOLN
10.0000 meq | INTRAVENOUS | Status: AC
  Administered 2018-07-24 (×6): 10 meq via INTRAVENOUS
  Filled 2018-07-24 (×6): qty 100

## 2018-07-24 NOTE — Consult Note (Signed)
PHARMACY CONSULT NOTE   Pharmacy Consult for Electrolyte Monitoring  Indication: High Risk for Refeeding Syndrome  LABS: Potassium (mmol/L)  Date Value  07/24/2018 3.0 (L)  03/01/2014 3.8   Magnesium (mg/dL)  Date Value  45/40/981108/03/2018 1.5 (L)  03/01/2014 1.6 (L)   Phosphorus (mg/dL)  Date Value  91/47/829508/03/2018 2.7   Calcium (mg/dL)  Date Value  62/13/086508/03/2018 7.2 (L)   Calcium, Total (mg/dL)  Date Value  78/46/962903/11/2014 8.6   Albumin (g/dL)  Date Value  52/84/132408/02/2018 2.6 (L)  10/30/2013 3.5  ]  Estimated Creatinine Clearance: 44.4 mL/min (A) (by C-G formula based on SCr of 0.39 mg/dL (L)).    Assessment: Pharmacy consulted for electrolyte monitoring and replacement in 77 yo female at risk for refeeding syndrome.   Goal of Therapy:  Electrolytes WNL  Plan:  8/4 AM   K: 3.0   Phos: 2.7   Mg: 1.5   Will replace with KCL 40mEq x 2 doses and Magnesium 2g IV x 1 dose.  Will recheck electrolytes with AM labs and continue to replace as needed.   Gardner CandleSheema M Jaeleah Smyser, PharmD, BCPS Clinical Pharmacist 07/24/2018 7:07 AM

## 2018-07-24 NOTE — Progress Notes (Signed)
PT Cancellation Note  Patient Details Name: Daisy Long MRN: [id number] DOB: March 25, 1941   Cancelled Treatment:    Reason Eval/Treat Not Completed: Patient's level of consciousness Spoke with nursing who report that pt has been sleepy, agitated to any touch and states that generally she is likely not at all appropriate for PT today.   Malachi ProGalen R Da Authement, DPT 07/24/2018, 2:02 PM

## 2018-07-24 NOTE — Progress Notes (Signed)
Pt is total care; becomes agitated/combative when trying to provide care; unable to follow directions. Talks about random subjects; flightly speech; very confused. Pulled off hand mittens frequently with multiple reapplications done by staff. Pt did pull IV out and was restarted with 1+ assistance which pt tolerated well. Increasing generalized edema especially in arms, trunk and some in legs. Pt pulled at foley at intervals. After comfort measures, redirecting- haldol 0.5 mg IV given with partial relief. Refuses all meds and all attempts to feed pt with MD aware. K+ 3.0 with K+ 60 meq IV given. Continued IV Ancef with IVF's slowed to 60 ml/hr. Awaiting palliative care and neuro consult.

## 2018-07-24 NOTE — Progress Notes (Signed)
[employer] [facility] [place] at [facility]   PATIENT NAME: Daisy Long    MR#:  [id number]  DATE OF BIRTH:  1941/07/11  SUBJECTIVE:  CHIEF COMPLAINT:   Chief Complaint  Patient presents with  . Altered Mental Status   -About the same as yesterday.  Confused, not allowing care.  Minimal oral intake. - no fevers  REVIEW OF SYSTEMS:  Review of Systems  Unable to perform ROS: Mental status change    DRUG ALLERGIES:   Allergies  Allergen Reactions  . Levothyroxine Other (See Comments)    Increased blood pressure  . Triamterene-Hctz     VITALS:  Blood pressure (!) 94/54, pulse 87, temperature 97.8 F (36.6 C), temperature source Oral, resp. rate 15, height 5\' 1"  (1.549 m), weight 49.4 kg (109 lb), SpO2 98 %.  PHYSICAL EXAMINATION:  Physical Exam  GENERAL:  77 y.o.-year-old ill nourished patient lying in the bed with no acute distress.  EYES: Pupils equal, round, reactive to light and accommodation. No scleral icterus. Extraocular muscles intact.  HEENT: Head atraumatic, normocephalic. Oropharynx and nasopharynx clear.  NECK:  Supple, no jugular venous distention. No thyroid enlargement, no tenderness.  LUNGS: Normal breath sounds bilaterally, no wheezing, rales,rhonchi or crepitation. No use of accessory muscles of respiration.  Decreased bibasilar breath sounds CARDIOVASCULAR: S1, S2 normal. No rubs, or gallops.  2/6 systolic murmur present ABDOMEN: Soft, nontender, nondistended. Bowel sounds present. No organomegaly or mass.  EXTREMITIES: No pedal edema, cyanosis, or clubbing.  NEUROLOGIC: Cranial nerves II through XII are intact.moving in bed, all extremities.. Confused and not following commands Sensation intact. PSYCHIATRIC: The patient is alert and oriented to self, not very cooperative for care.  SKIN: No obvious rash, lesion, or ulcer.    LABORATORY PANEL:   CBC Recent Labs  Lab 07/24/18 0432  WBC 10.2  HGB 9.4*  HCT 26.8*  PLT 155    ------------------------------------------------------------------------------------------------------------------  Chemistries  Recent Labs  Lab 07/23/18 0357 07/24/18 0432  NA 142 140  K 3.4* 3.0*  CL 111 113*  CO2 25 22  GLUCOSE 89 83  BUN <5* <5*  CREATININE 0.36* 0.39*  CALCIUM 7.7* 7.2*  MG 1.7 1.5*  AST 101*  --   ALT 19  --   ALKPHOS 99  --   BILITOT 1.2  --    ------------------------------------------------------------------------------------------------------------------  Cardiac Enzymes Recent Labs  Lab 07/19/18 1156  TROPONINI <0.03   ------------------------------------------------------------------------------------------------------------------  RADIOLOGY:  No results found.  EKG:   Orders placed or performed during the hospital encounter of 07/19/18  . EKG 12-Lead  . EKG 12-Lead  . ED EKG  . ED EKG    ASSESSMENT AND PLAN:   77 year old female with past medical history significant for alcohol abuse, hypertension, thyroid disease was brought in secondary to altered mental status  1.  Altered mental status-toxic metabolic encephalopathy - concern for underlying dementia and wernicke's encephalopathy -Alcohol intoxication- no hallucinations now -CT of the head with atrophy and chronic microvascular ischemic changes but no acute abnormality -MRI of the brain with mild atrophy and no acute findings -Ammonia is within normal limits -Psych consult appreciated.  Concern for Wernicke's encephalopathy -Continue to monitor.  Physical therapy when able to -Patient not improving, clinically declining.  Will consult neurology.  To see if patient will need EEG or LP  2.  Electrolyte abnormalities-hypokalemia and hypomagnesemia being replaced -Pharmacy has been consulted.  Dietary consult for refeeding syndrome - Patient with poor oral intake- approached daughter- discussed with  PEG- patient never wanted a PEG tube- will hold off - palliative care  consult  3.  Acute urinary retention-started on Flomax.   Now has a Foley catheter  4.  Elevated LFTs-likely alcoholic liver cirrhosis.  Ultrasound showing fatty liver-Monitor LFTs  5.  Hypertension- lisinopril  6.  Klebsiella bacteremia- likely source urine and GI.  Patient was laying in her feces when found. -  Urine cultures growing klebsiella.  Currently on Ancef- treat for 14 days  7.  DVT prophylaxis-on Lovenox  PT consult when stable Updated daughter at bedside yesterday    All the records are reviewed and case discussed with Care Management/Social Workerr. Management plans discussed with the patient, family and they are in agreement.  CODE STATUS: Full code  TOTAL TIME TAKING CARE OF THIS PATIENT: 36  minutes.   POSSIBLE D/C IN 2-3 DAYS, DEPENDING ON CLINICAL CONDITION.   Enid BaasKALISETTI,Takeya Marquis M.D on 07/24/2018 at 11:03 AM  Between 7am to 6pm - Pager - [phone]  After 6pm go to [email or web address] - Social research officer, governmentpassword EPAS [email or web address]  [facility]  Office  [phone]  CC: Primary care physician; Karie SchwalbeLetvak, Richard I, MD

## 2018-07-24 NOTE — Progress Notes (Signed)
Foley output 150 ml this shift so far. Paged MD with update on I&0 with edema status; no respiratory issues.

## 2018-07-24 NOTE — Consult Note (Signed)
Reason for Consult: confusion Referring Physician: Dr. Wiliam Ke   CC: confusion   HPI: Daisy Long is an 77 y.o. brought in on 7/30 with altered mental status.  Multiple metabolic problems.       Past Medical History:  Diagnosis Date  . Anxiety   . Hyperlipidemia   . Hypertension   . Thyroid disease     Past Surgical History:  Procedure Laterality Date  . TUBAL LIGATION    . VAGINAL DELIVERY     X3    Family History  Problem Relation Age of Onset  . Coronary artery disease Mother   . Coronary artery disease Daughter   . Breast cancer Maternal Aunt   . Breast cancer Maternal Grandmother     Social History:  reports that she has been smoking cigarettes.  She has never used smokeless tobacco. She reports that she drinks alcohol. She reports that she does not use drugs.  Allergies  Allergen Reactions  . Levothyroxine Other (See Comments)    Increased blood pressure  . Triamterene-Hctz     Medications: I have reviewed the patient's current medications.  ROS: Unable to obtain due to confusion   Physical Examination: Blood pressure 93/77, pulse 89, temperature 97.8 F (36.6 C), temperature source Oral, resp. rate 18, height 5\' 1"  (1.549 m), weight 109 lb (49.4 kg), SpO2 98 %.  Able to tell me her name and follow minimal commands Basic cranial nerves are intact.  Generalized weakness No clear focality.    Laboratory Studies:   Basic Metabolic Panel: Recent Labs  Lab 07/20/18 0353 07/21/18 0347 07/22/18 0523 07/23/18 0357 07/24/18 0432  NA 141 140 140 142 140  K 3.4* 3.6 3.4* 3.4* 3.0*  CL 106 107 109 111 113*  CO2 26 25 25 25 22   GLUCOSE 74 121* 85 89 83  BUN 10 7* 5* <5* <5*  CREATININE 0.53 0.40* 0.47 0.36* 0.39*  CALCIUM 7.3* 7.6* 7.7* 7.7* 7.2*  MG 1.6* 1.9 1.8 1.7 1.5*  PHOS 2.8 1.7* 2.0* 3.0 2.7    Liver Function Tests: Recent Labs  Lab 07/19/18 1156 07/20/18 0353 07/21/18 0347 07/23/18 0357  AST 114* 90* 84* 101*  ALT 27 22 20 19    ALKPHOS 152* 129* 119 99  BILITOT 2.9* 2.6* 1.7* 1.2  PROT 6.6 5.1* 5.1* 5.2*  ALBUMIN 2.6* 2.0* 2.4* 2.6*   No results for input(s): LIPASE, AMYLASE in the last 168 hours. Recent Labs  Lab 07/19/18 1527  AMMONIA 20    CBC: Recent Labs  Lab 07/19/18 1156 07/24/18 0432  WBC 11.9* 10.2  NEUTROABS 10.7*  --   HGB 12.1 9.4*  HCT 35.5 26.8*  MCV 114.8* 114.4*  PLT 195 155    Cardiac Enzymes: Recent Labs  Lab 07/19/18 1156  CKTOTAL 19*  TROPONINI <0.03    BNP: Invalid input(s): POCBNP  CBG: No results for input(s): GLUCAP in the last 168 hours.  Microbiology: Results for orders placed or performed during the hospital encounter of 07/19/18  Culture, blood (routine x 2)     Status: Abnormal   Collection Time: 07/19/18 11:56 AM  Result Value Ref Range Status   Specimen Description   Final    BLOOD RIGHT ANTECUBITAL Performed at [facility], [address], [place], [place] [address]    Special Requests   Final    BOTTLES DRAWN AEROBIC AND ANAEROBIC Blood Culture adequate volume Performed at [facility], [address] [place], [place] [address]  Culture  Setup Time   Final    GRAM NEGATIVE RODS IN BOTH AEROBIC AND ANAEROBIC BOTTLES CRITICAL RESULT CALLED TO, READ BACK BY AND VERIFIED WITH: DAVID BESANTI AT [id number] 07/20/18 SDR Performed at [facility], [address] [place], [place] [address]    Culture KLEBSIELLA PNEUMONIAE (A)  Final   Report Status 07/22/2018 FINAL  Final   Organism ID, Bacteria KLEBSIELLA PNEUMONIAE  Final      Susceptibility   Klebsiella pneumoniae - MIC*    AMPICILLIN RESISTANT Resistant     CEFAZOLIN <=4 SENSITIVE Sensitive     CEFEPIME <=1 SENSITIVE Sensitive     CEFTAZIDIME <=1 SENSITIVE Sensitive     CEFTRIAXONE <=1 SENSITIVE Sensitive     CIPROFLOXACIN <=0.25 SENSITIVE Sensitive     GENTAMICIN <=1 SENSITIVE Sensitive     IMIPENEM <=0.25 SENSITIVE Sensitive     TRIMETH/SULFA <=20 SENSITIVE Sensitive      AMPICILLIN/SULBACTAM 4 SENSITIVE Sensitive     PIP/TAZO <=4 SENSITIVE Sensitive     Extended ESBL NEGATIVE Sensitive     * KLEBSIELLA PNEUMONIAE  Blood Culture ID Panel (Reflexed)     Status: Abnormal   Collection Time: 07/19/18 11:56 AM  Result Value Ref Range Status   Enterococcus species NOT DETECTED NOT DETECTED Final   Listeria monocytogenes NOT DETECTED NOT DETECTED Final   Staphylococcus species NOT DETECTED NOT DETECTED Final   Staphylococcus aureus NOT DETECTED NOT DETECTED Final   Streptococcus species NOT DETECTED NOT DETECTED Final   Streptococcus agalactiae NOT DETECTED NOT DETECTED Final   Streptococcus pneumoniae NOT DETECTED NOT DETECTED Final   Streptococcus pyogenes NOT DETECTED NOT DETECTED Final   Acinetobacter baumannii NOT DETECTED NOT DETECTED Final   Enterobacteriaceae species DETECTED (A) NOT DETECTED Final    Comment: Enterobacteriaceae represent a large family of gram-negative bacteria, not a single organism. CRITICAL RESULT CALLED TO, READ BACK BY AND VERIFIED WITH: DAVID BESANTI AT 0514 07/20/18 SDR    Enterobacter cloacae complex NOT DETECTED NOT DETECTED Final   Escherichia coli NOT DETECTED NOT DETECTED Final   Klebsiella oxytoca NOT DETECTED NOT DETECTED Final   Klebsiella pneumoniae DETECTED (A) NOT DETECTED Final    Comment: CRITICAL RESULT CALLED TO, READ BACK BY AND VERIFIED WITH:  DAVID BESANTI AT 0517 07/20/18 SDR    Proteus species NOT DETECTED NOT DETECTED Final   Serratia marcescens NOT DETECTED NOT DETECTED Final   Carbapenem resistance NOT DETECTED NOT DETECTED Final   Haemophilus influenzae NOT DETECTED NOT DETECTED Final   Neisseria meningitidis NOT DETECTED NOT DETECTED Final   Pseudomonas aeruginosa NOT DETECTED NOT DETECTED Final   Candida albicans NOT DETECTED NOT DETECTED Final   Candida glabrata NOT DETECTED NOT DETECTED Final   Candida krusei NOT DETECTED NOT DETECTED Final   Candida parapsilosis NOT DETECTED NOT DETECTED  Final   Candida tropicalis NOT DETECTED NOT DETECTED Final    Comment: Performed at [facility], [address] [place], [place] [address]  Urine culture     Status: Abnormal   Collection Time: 07/19/18  4:26 PM  Result Value Ref Range Status   Specimen Description   Final    URINE, RANDOM Performed at [facility], [address] [place], [place] [address]    Special Requests   Final    NONE Performed at [facility], [address] [place], [place] [address]    Culture 10,000 COLONIES/mL KLEBSIELLA PNEUMONIAE (A)  Final   Report Status 07/22/2018 FINAL  Final   Organism ID, Bacteria KLEBSIELLA PNEUMONIAE (A)  Final      Susceptibility   Klebsiella pneumoniae - MIC*    AMPICILLIN RESISTANT Resistant     CEFAZOLIN <=4 SENSITIVE Sensitive     CEFTRIAXONE <=1 SENSITIVE Sensitive     CIPROFLOXACIN <=0.25 SENSITIVE Sensitive     GENTAMICIN <=1 SENSITIVE Sensitive     IMIPENEM <=0.25 SENSITIVE Sensitive     NITROFURANTOIN 32 SENSITIVE Sensitive     TRIMETH/SULFA <=20 SENSITIVE Sensitive     AMPICILLIN/SULBACTAM 4 SENSITIVE Sensitive     PIP/TAZO <=4 SENSITIVE Sensitive     Extended ESBL NEGATIVE Sensitive     * 10,000 COLONIES/mL KLEBSIELLA PNEUMONIAE  Culture, blood (routine x 2)     Status: None   Collection Time: 07/19/18  5:01 PM  Result Value Ref Range Status   Specimen Description BLOOD RIGHT ANTECUBITAL  Final   Special Requests   Final    Blood Culture results may not be optimal due to an inadequate volume of blood received in culture bottles   Culture   Final    NO GROWTH 5 DAYS Performed at [facility], [address] [place], [place] [address]    Report Status 07/24/2018 FINAL  Final    Coagulation Studies: No results for input(s): LABPROT, INR in the last 72 hours.  Urinalysis:  Recent Labs  Lab 07/19/18 1450 07/22/18 1052  COLORURINE AMBER* AMBER*  LABSPEC 1.021 1.026  PHURINE 5.0 TEST NOT REPORTED DUE TO  COLOR INTERFERENCE OF URINE PIGMENT  GLUCOSEU NEGATIVE TEST NOT REPORTED DUE TO COLOR INTERFERENCE OF URINE PIGMENT*  HGBUR NEGATIVE TEST NOT REPORTED DUE TO COLOR INTERFERENCE OF URINE PIGMENT*  BILIRUBINUR SMALL* TEST NOT REPORTED DUE TO COLOR INTERFERENCE OF URINE PIGMENT*  KETONESUR 5* TEST NOT REPORTED DUE TO COLOR INTERFERENCE OF URINE PIGMENT*  PROTEINUR NEGATIVE TEST NOT REPORTED DUE TO COLOR INTERFERENCE OF URINE PIGMENT*  NITRITE NEGATIVE TEST NOT REPORTED DUE TO COLOR INTERFERENCE OF URINE PIGMENT*  LEUKOCYTESUR NEGATIVE TEST NOT REPORTED DUE TO COLOR INTERFERENCE OF URINE PIGMENT*    Lipid Panel:     Component Value Date/Time   CHOL 239 (H) 03/29/2015 1124   TRIG 144.0 03/29/2015 1124   HDL 55.90 03/29/2015 1124   CHOLHDL 4 03/29/2015 1124   VLDL 28.8 03/29/2015 1124   LDLCALC 154 (H) 03/29/2015 1124    HgbA1C: No results found for: HGBA1C  Urine Drug Screen:      Component Value Date/Time   LABOPIA NONE DETECTED 07/19/2018 1450   COCAINSCRNUR NONE DETECTED 07/19/2018 1450   LABBENZ NONE DETECTED 07/19/2018 1450   AMPHETMU NONE DETECTED 07/19/2018 1450   THCU NONE DETECTED 07/19/2018 1450   LABBARB NONE DETECTED 07/19/2018 1450    Alcohol Level:  Recent Labs  Lab 07/19/18 1156  ETH 86*     Imaging: No results found.   Assessment/Plan:  77 year old female with past medical history significant for alcohol abuse, hypertension, thyroid disease was brought in secondary to altered mental status  - Pt has multiple medical problems and current symptoms are likely metabolic in nature - She is on thiamine/folate - I don't think there is a reason for LP given WBC of 10 and no fever, no neck stiffness/rigidity - likely progressive process with poor baseline  - do not think this is seizure related - Primary treatment per medical team.   - Agree with possibility of palliative evaluation if family agrees.    07/24/2018, 1:22 PM

## 2018-07-25 ENCOUNTER — Other Ambulatory Visit: Payer: Medicare PPO

## 2018-07-25 DIAGNOSIS — Z515 Encounter for palliative care: Secondary | ICD-10-CM

## 2018-07-25 DIAGNOSIS — F1010 Alcohol abuse, uncomplicated: Secondary | ICD-10-CM

## 2018-07-25 DIAGNOSIS — Z7189 Other specified counseling: Secondary | ICD-10-CM

## 2018-07-25 LAB — BASIC METABOLIC PANEL
Anion gap: 7 (ref 5–15)
CALCIUM: 7.2 mg/dL — AB (ref 8.9–10.3)
CHLORIDE: 113 mmol/L — AB (ref 98–111)
CO2: 20 mmol/L — AB (ref 22–32)
CREATININE: 0.42 mg/dL — AB (ref 0.44–1.00)
GFR calc non Af Amer: >60 mL/min (ref >60)
Glucose, Bld: 78 mg/dL (ref 70–99)
Potassium: 3.6 mmol/L (ref 3.5–5.1)
SODIUM: 140 mmol/L (ref 135–145)

## 2018-07-25 LAB — MAGNESIUM: MAGNESIUM: 1.9 mg/dL (ref 1.7–2.4)

## 2018-07-25 LAB — PHOSPHORUS: Phosphorus: 2.5 mg/dL (ref 2.5–4.6)

## 2018-07-25 MED ORDER — LORAZEPAM 2 MG/ML PO CONC: 1.0000 mg | Freq: Four times a day (QID) | ORAL | 0 refills | Status: AC | PRN

## 2018-07-25 MED ORDER — POLYVINYL ALCOHOL 1.4 % OP SOLN
1.0000 [drp] | Freq: Four times a day (QID) | OPHTHALMIC | Status: DC | PRN
  Filled 2018-07-25: qty 15

## 2018-07-25 MED ORDER — GLYCOPYRROLATE 0.2 MG/ML IJ SOLN
0.2000 mg | INTRAMUSCULAR | Status: DC | PRN
  Filled 2018-07-25: qty 1

## 2018-07-25 MED ORDER — POTASSIUM PHOSPHATES 15 MMOLE/5ML IV SOLN
10.0000 mmol | Freq: Once | INTRAVENOUS | Status: AC
  Administered 2018-07-25: 10 mmol via INTRAVENOUS
  Filled 2018-07-25: qty 3.33

## 2018-07-25 MED ORDER — MORPHINE SULFATE (CONCENTRATE) 10 MG/0.5ML PO SOLN: 5.0000 mg | ORAL | Status: DC | PRN

## 2018-07-25 MED ORDER — GLYCOPYRROLATE 1 MG PO TABS: 1.0000 mg | ORAL_TABLET | ORAL | 0 refills | Status: AC | PRN

## 2018-07-25 MED ORDER — BIOTENE DRY MOUTH MT LIQD: 15.0000 mL | OROMUCOSAL | Status: DC | PRN

## 2018-07-25 MED ORDER — LORAZEPAM 2 MG/ML IJ SOLN: 1.0000 mg | INTRAMUSCULAR | Status: DC | PRN

## 2018-07-25 MED ORDER — MORPHINE SULFATE (CONCENTRATE) 10 MG/0.5ML PO SOLN: 5.0000 mg | ORAL | 0 refills | Status: AC | PRN

## 2018-07-25 MED ORDER — GLYCOPYRROLATE 1 MG PO TABS
1.0000 mg | ORAL_TABLET | ORAL | Status: DC | PRN
  Filled 2018-07-25: qty 1

## 2018-07-25 NOTE — Progress Notes (Signed)
   07/25/18 1400  Clinical Encounter Type  Visited With Patient  Visit Type Initial;Spiritual support  Referral From Nurse  Consult/Referral To Chaplain  Spiritual Encounters  Spiritual Needs Prayer;Emotional   Ch visited with patient while rounding on 1C. Ms. Daisy Long was awake but appeared confused. She greeted me with good morning while it was 2:30 in the afternoon. She seemed surprised when I told her that it was 2:30 PM. But she accepted the time.  I opened the blinds for the patient and she seemed pleased. I prayed and will follow up as needed.

## 2018-07-25 NOTE — Progress Notes (Signed)
Nutrition Brief Follow-Up Note  Chart reviewed. Patient now transitioning to comfort care.   No further nutrition interventions warranted at this time.  Please re-consult RD as needed.   Mihika Surrette, MS, RD, LDN Office: [phone] Pager: [phone] After Hours/Weekend Pager: [phone]    

## 2018-07-25 NOTE — Progress Notes (Signed)
PT Cancellation Note  Patient Details Name: Consuela MimesJanice M Kovacevic MRN: [id number] DOB: 1941-09-23   Cancelled Treatment:    Reason Eval/Treat Not Completed: Patient's level of consciousness.  Order received.  Chart reviewed.  RN consulted and pt is still too disoriented and agitated to participate in an evaluation.  Will re-attempt later if time allows.   Glenetta HewSarah Naesha Buckalew, PT, DPT 07/25/2018, 9:19 AM

## 2018-07-25 NOTE — Discharge Summary (Signed)
[employer] [facility] [place] [facility]   PATIENT NAME: Daisy Long    MR#:  [id number]  DATE OF BIRTH:  [birth year]  DATE OF ADMISSION:  07/19/2018   ADMITTING PHYSICIAN: Bertrum SolMontell D Salary, MD  DATE OF DISCHARGE:  07/25/18  PRIMARY CARE PHYSICIAN: Karie SchwalbeLetvak, Richard I, MD   ADMISSION DIAGNOSIS:   Confusion [R41.0] Thoracic aortic aneurysm (HCC) [I71.2] Altered mental status, unspecified altered mental status type [R41.82]  DISCHARGE DIAGNOSIS:   Principal Problem:   Acute delirium Active Problems:   Toxic metabolic encephalopathy   Protein-calorie malnutrition, severe   Dementia   Alcohol abuse   SECONDARY DIAGNOSIS:   Past Medical History:  Diagnosis Date  . Anxiety   . Hyperlipidemia   . Hypertension   . Thyroid disease     HOSPITAL COURSE:   77 year old female with past medical history significant for alcohol abuse, hypertension, thyroid disease was brought in secondary to altered mental status  1.  Altered mental status-toxic metabolic encephalopathy and concern for wernicke's ecnephalopathy - concern for underlying dementia as well - progressively worsening -Alcohol intoxication- no hallucinations now -CT of the head with atrophy and chronic microvascular ischemic changes but no acute abnormality -MRI of the brain with mild atrophy and no acute findings -Ammonia is within normal limits -Psych consult and neuro consults appreciated.   - no indication for LP or EEG - patient transitioned to comfort care and will be discharged to hospice home today  2.  Electrolyte abnormalities-hypokalemia and hypomagnesemia  Replaced through out hospital stay - poor oral intake. No PEG tube per patients wishes  3.  Acute urinary retention- received  Flomax and has a Foley catheter  4.  Elevated LFTs-alcoholic liver cirrhosis.  Ultrasound showing fatty liver-  5.  Hypertension- was on meds while in the hospital- stopped at discharge  6.  Klebsiella  bacteremia- likely source urine and GI.  Patient was laying in her feces when found. -  Urine cultures growing klebsiella.  received ancef initially- now stopped as transitioned to comfort care  Discussed with daughter at bedside and also palliative care team. -Patient will be transition to hospice home today    DISCHARGE CONDITIONS:   Guarded  CONSULTS OBTAINED:   Treatment Team:  Clapacs, Jackquline DenmarkJohn T, MD Pauletta BrownsZeylikman, Yuriy, MD  DRUG ALLERGIES:   Allergies  Allergen Reactions  . Levothyroxine Other (See Comments)    Increased blood pressure  . Triamterene-Hctz    DISCHARGE MEDICATIONS:   Allergies as of 07/25/2018      Reactions   Levothyroxine Other (See Comments)   Increased blood pressure   Triamterene-hctz       Medication List    STOP taking these medications   b complex vitamins tablet   lisinopril 20 MG tablet Commonly known as:  PRINIVIL,ZESTRIL   vitamin B-12 100 MCG tablet Commonly known as:  CYANOCOBALAMIN   vitamin C 1000 MG tablet     TAKE these medications   glycopyrrolate 1 MG tablet Commonly known as:  ROBINUL Take 1 tablet (1 mg total) by mouth every 4 (four) hours as needed (excessive secretions).   LORazepam 2 MG/ML concentrated solution Commonly known as:  ATIVAN Take 0.5 mLs (1 mg total) by mouth every 6 (six) hours as needed for anxiety.   morphine CONCENTRATE 10 MG/0.5ML Soln concentrated solution Place 0.25-0.5 mLs (5-10 mg total) under the tongue every 2 (two) hours as needed for severe pain or shortness of breath.  DISCHARGE INSTRUCTIONS:   1.  discharged to hospice home  DIET:   Regular diet  ACTIVITY:   Activity as tolerated  OXYGEN:   Home Oxygen: No.  Oxygen Delivery: room air  DISCHARGE LOCATION:   Hospice Home   If you experience worsening of your admission symptoms, develop shortness of breath, life threatening emergency, suicidal or homicidal thoughts you must seek medical attention immediately by  calling 911 or calling your MD immediately  if symptoms less severe.  You Must read complete instructions/literature along with all the possible adverse reactions/side effects for all the Medicines you take and that have been prescribed to you. Take any new Medicines after you have completely understood and accpet all the possible adverse reactions/side effects.   Please note  You were cared for by a hospitalist during your hospital stay. If you have any questions about your discharge medications or the care you received while you were in the hospital after you are discharged, you can call the unit and asked to speak with the hospitalist on call if the hospitalist that took care of you is not available. Once you are discharged, your primary care physician will handle any further medical issues. Please note that NO REFILLS for any discharge medications will be authorized once you are discharged, as it is imperative that you return to your primary care physician (or establish a relationship with a primary care physician if you do not have one) for your aftercare needs so that they can reassess your need for medications and monitor your lab values.    On the day of Discharge:  VITAL SIGNS:   Blood pressure (!) 118/94, pulse 80, temperature 98.5 F (36.9 C), temperature source Oral, resp. rate 16, height 5\' 1"  (1.549 m), weight 49.4 kg (109 lb), SpO2 95 %.  PHYSICAL EXAMINATION:   GENERAL:  77 y.o.-year-old ill nourished patient lying in the bed with no acute distress.  EYES: Pupils equal, round, reactive to light and accommodation. No scleral icterus. Extraocular muscles intact.  HEENT: Head atraumatic, normocephalic. Oropharynx and nasopharynx clear.  NECK:  Supple, no jugular venous distention. No thyroid enlargement, no tenderness.  LUNGS: Normal breath sounds bilaterally, no wheezing, rales,rhonchi or crepitation. No use of accessory muscles of respiration.  Decreased bibasilar breath  sounds CARDIOVASCULAR: S1, S2 normal. No rubs, or gallops.  2/6 systolic murmur present ABDOMEN: Soft, nontender, nondistended. Bowel sounds present. No organomegaly or mass.  EXTREMITIES: No pedal edema, cyanosis, or clubbing.  NEUROLOGIC: Cranial nerves II through XII are intact.moving in bed, all extremities.. Confused and not following commands Sensation intact. PSYCHIATRIC: The patient is lethargic, and disoriented, not very cooperative for care.  SKIN: No obvious rash, lesion, or ulcer.    DATA REVIEW:   CBC Recent Labs  Lab 07/24/18 0432  WBC 10.2  HGB 9.4*  HCT 26.8*  PLT 155    Chemistries  Recent Labs  Lab 07/23/18 0357  07/25/18 0532  NA 142   < > 140  K 3.4*   < > 3.6  CL 111   < > 113*  CO2 25   < > 20*  GLUCOSE 89   < > 78  BUN <5*   < > <5*  CREATININE 0.36*   < > 0.42*  CALCIUM 7.7*   < > 7.2*  MG 1.7   < > 1.9  AST 101*  --   --   ALT 19  --   --   ALKPHOS 99  --   --  BILITOT 1.2  --   --    < > = values in this interval not displayed.     Microbiology Results  Results for orders placed or performed during the hospital encounter of 07/19/18  Culture, blood (routine x 2)     Status: Abnormal   Collection Time: 07/19/18 11:56 AM  Result Value Ref Range Status   Specimen Description   Final    BLOOD RIGHT ANTECUBITAL Performed at [facility], [address] [place], [place] [address]    Special Requests   Final    BOTTLES DRAWN AEROBIC AND ANAEROBIC Blood Culture adequate volume Performed at [facility], [address] [place], [place] [address]    Culture  Setup Time   Final    GRAM NEGATIVE RODS IN BOTH AEROBIC AND ANAEROBIC BOTTLES CRITICAL RESULT CALLED TO, READ BACK BY AND VERIFIED WITH: DAVID BESANTI AT 5284 07/20/18 SDR Performed at [facility], [address], [place], [place] [address]    Culture KLEBSIELLA PNEUMONIAE (A)  Final   Report Status 07/22/2018 FINAL  Final   Organism ID, Bacteria  KLEBSIELLA PNEUMONIAE  Final      Susceptibility   Klebsiella pneumoniae - MIC*    AMPICILLIN RESISTANT Resistant     CEFAZOLIN <=4 SENSITIVE Sensitive     CEFEPIME <=1 SENSITIVE Sensitive     CEFTAZIDIME <=1 SENSITIVE Sensitive     CEFTRIAXONE <=1 SENSITIVE Sensitive     CIPROFLOXACIN <=0.25 SENSITIVE Sensitive     GENTAMICIN <=1 SENSITIVE Sensitive     IMIPENEM <=0.25 SENSITIVE Sensitive     TRIMETH/SULFA <=20 SENSITIVE Sensitive     AMPICILLIN/SULBACTAM 4 SENSITIVE Sensitive     PIP/TAZO <=4 SENSITIVE Sensitive     Extended ESBL NEGATIVE Sensitive     * KLEBSIELLA PNEUMONIAE  Blood Culture ID Panel (Reflexed)     Status: Abnormal   Collection Time: 07/19/18 11:56 AM  Result Value Ref Range Status   Enterococcus species NOT DETECTED NOT DETECTED Final   Listeria monocytogenes NOT DETECTED NOT DETECTED Final   Staphylococcus species NOT DETECTED NOT DETECTED Final   Staphylococcus aureus NOT DETECTED NOT DETECTED Final   Streptococcus species NOT DETECTED NOT DETECTED Final   Streptococcus agalactiae NOT DETECTED NOT DETECTED Final   Streptococcus pneumoniae NOT DETECTED NOT DETECTED Final   Streptococcus pyogenes NOT DETECTED NOT DETECTED Final   Acinetobacter baumannii NOT DETECTED NOT DETECTED Final   Enterobacteriaceae species DETECTED (A) NOT DETECTED Final    Comment: Enterobacteriaceae represent a large family of gram-negative bacteria, not a single organism. CRITICAL RESULT CALLED TO, READ BACK BY AND VERIFIED WITH: DAVID BESANTI AT 0514 07/20/18 SDR    Enterobacter cloacae complex NOT DETECTED NOT DETECTED Final   Escherichia coli NOT DETECTED NOT DETECTED Final   Klebsiella oxytoca NOT DETECTED NOT DETECTED Final   Klebsiella pneumoniae DETECTED (A) NOT DETECTED Final    Comment: CRITICAL RESULT CALLED TO, READ BACK BY AND VERIFIED WITH:  DAVID BESANTI AT 0517 07/20/18 SDR    Proteus species NOT DETECTED NOT DETECTED Final   Serratia marcescens NOT DETECTED NOT  DETECTED Final   Carbapenem resistance NOT DETECTED NOT DETECTED Final   Haemophilus influenzae NOT DETECTED NOT DETECTED Final   Neisseria meningitidis NOT DETECTED NOT DETECTED Final   Pseudomonas aeruginosa NOT DETECTED NOT DETECTED Final   Candida albicans NOT DETECTED NOT DETECTED Final   Candida glabrata NOT DETECTED NOT DETECTED Final   Candida krusei NOT DETECTED NOT DETECTED  Final   Candida parapsilosis NOT DETECTED NOT DETECTED Final   Candida tropicalis NOT DETECTED NOT DETECTED Final    Comment: Performed at [facility], [address] [place], [place] [address]  Urine culture     Status: Abnormal   Collection Time: 07/19/18  4:26 PM  Result Value Ref Range Status   Specimen Description   Final    URINE, RANDOM Performed at [facility], [address], [place], [place] [address]    Special Requests   Final    NONE Performed at [facility], [address] [place], [place] [address]    Culture 10,000 COLONIES/mL KLEBSIELLA PNEUMONIAE (A)  Final   Report Status 07/22/2018 FINAL  Final   Organism ID, Bacteria KLEBSIELLA PNEUMONIAE (A)  Final      Susceptibility   Klebsiella pneumoniae - MIC*    AMPICILLIN RESISTANT Resistant     CEFAZOLIN <=4 SENSITIVE Sensitive     CEFTRIAXONE <=1 SENSITIVE Sensitive     CIPROFLOXACIN <=0.25 SENSITIVE Sensitive     GENTAMICIN <=1 SENSITIVE Sensitive     IMIPENEM <=0.25 SENSITIVE Sensitive     NITROFURANTOIN 32 SENSITIVE Sensitive     TRIMETH/SULFA <=20 SENSITIVE Sensitive     AMPICILLIN/SULBACTAM 4 SENSITIVE Sensitive     PIP/TAZO <=4 SENSITIVE Sensitive     Extended ESBL NEGATIVE Sensitive     * 10,000 COLONIES/mL KLEBSIELLA PNEUMONIAE  Culture, blood (routine x 2)     Status: None   Collection Time: 07/19/18  5:01 PM  Result Value Ref Range Status   Specimen Description BLOOD RIGHT ANTECUBITAL  Final   Special Requests   Final    Blood Culture results may not be optimal due to an inadequate  volume of blood received in culture bottles   Culture   Final    NO GROWTH 5 DAYS Performed at [facility], [address], [place], [place] [address]    Report Status 07/24/2018 FINAL  Final    RADIOLOGY:  No results found.   Management plans discussed with the patient, family and they are in agreement.  CODE STATUS:     Code Status Orders  (From admission, onward)        Start     Ordered   07/25/18 1012  Do not attempt resuscitation (DNR)  Continuous    Question Answer Comment  In the event of cardiac or respiratory ARREST Do not call a "code blue"   In the event of cardiac or respiratory ARREST Do not perform Intubation, CPR, defibrillation or ACLS   In the event of cardiac or respiratory ARREST Use medication by any route, position, wound care, and other measures to relive pain and suffering. May use oxygen, suction and manual treatment of airway obstruction as needed for comfort.      07/25/18 1012    Code Status History    Date Active Date Inactive Code Status Order ID Comments User Context   07/23/2018 1328 07/25/2018 1012 DNR [id number]  Enid Baas, MD Inpatient   07/19/2018 2257 07/23/2018 1328 Full Code [id number]  Bertrum Sol, MD Inpatient   07/16/2017 1548 07/18/2017 0024 Full Code [id number]  Katharina Caper, MD Inpatient      TOTAL TIME TAKING CARE OF THIS PATIENT: 38 minutes.    Saim Almanza M.D on 07/25/2018 at 1:58 PM  Between 7am to 6pm - Pager - [phone]  After 6pm go to [email or web address] - Therapist, nutritional Hospitalists  Office  [phone]  CC: Primary care physician; Venia Carbon, MD   Note: This dictation was prepared with Dragon dictation along with smaller phrase technology. Any transcriptional errors that result from this process are unintentional.

## 2018-07-25 NOTE — Care Management Important Message (Signed)
Important Message  Patient Details  Name: Daisy Long MRN: [id number] Date of Birth: 1941/01/23   Medicare Important Message Given:  Other (see comment) Daughter not in room.  Will try to reach by phone.   Olegario MessierKathy A [name] 07/25/2018, 11:30 AM

## 2018-07-25 NOTE — Progress Notes (Signed)
Patient discharged to hospice home per MD order.  EMS called for transportation. 

## 2018-07-25 NOTE — Clinical Social Work Note (Signed)
CSW received consult for Residential hospice placement. CSW spoke with patient's daughter Robby Sermonaulette Arsenault [phone] regarding hospice. Daughter states that she would like patient to go to [facility]. CSW made referral to Clydie BraunKaren, hospice liaison. CSW will continue to follow for discharge planning.   Ruthe Mannanandace Ebony Yorio MSW, [address]CSWA [phone]

## 2018-07-25 NOTE — Care Management (Signed)
From home. Family had not heard from patient in one week and asked apartment manager to check on her. Found altered. ETOH intoxication.  Receiving replacement of phosphorus, potassium and magnesium.  Has been assessed by neurology.  Patient so far not able to participate in a psych consult.  Has not been able to participate with physical therapy due to agitation and confusion.  Highest CIWA scale over passed 24 hours is 6.

## 2018-07-25 NOTE — Progress Notes (Signed)
Patient was compliant through-out the night. Stated she was "hungry" but refused to eat. A&Ox2. Pt slept soundly and allowed RN to give Lovenox injection. Hasn't removed/pulled at mitts. Edema is still present. Output this shift approximately .

## 2018-07-25 NOTE — Progress Notes (Signed)
PT Cancellation Note  Patient Details Name: Daisy MimesJanice M Schueler MRN: [id number] DOB: 1941/01/16   Cancelled Treatment:    Reason Eval/Treat Not Completed: Medical issues which prohibited therapy.  Pt is switching to comfort care and PT order has been discontinued.  Will remove pt from PT schedule at this time.   Glenetta HewSarah Emmalena Canny, PT, DPT 07/25/2018, 12:48 PM

## 2018-07-25 NOTE — Consult Note (Signed)
Consultation Note Date: 07/25/2018   Patient Name: Daisy Long  DOB: 1941/11/09  MRN: [id number]  Age / Sex: 77 y.o., female  PCP: Venia Carbon, MD Referring Physician: Gladstone Lighter, MD  Reason for Consultation: Goals of Care  HPI/Patient Profile: 77 y.o. female  with past medical history of anxiety, HTN, HLD, thyroid disease admitted on 07/19/2018 with altered mental status found down by EMS with dried fecal matter on body and very confused. Alcohol level in ED was 86, lactate 4, but ammonia was normal. CT head negative stroke or acute event ("atrophy and chronic microvascular ischemic change in white matter") and MRI head confirms the same. Hospitalization complicated by continued agitation and confusion. Question possible Wernicke's encephalopathy? Progressing dementia? Has continued to decline over hospitalization.   Clinical Assessment and Goals of Care: I met today with daughter, Daisy Long, while her mother slept. Daisy Long shares with me that her mother lived with her for 4 years until she decided she wanted her own place and moved out ~1.5 yrs ago. She says that her mother has always been a heavy drinking and was very affected when her son (Daisy Long's brother) died ~10 yrs ago. Daisy Long has 2 other living siblings in [place] that are estranged from Ms. Marlou Sa but Daisy Long has been trying to keep them informed on Ms. Gonzalo's condition. Ms. Lamore has a brother she is very close with arriving this evening.   I discussed more with Daisy Long where we go from here. Daisy Long shares that her mother has been clear to her in conversations past that she would not want her life prolonged artificially. DNR confirmed and Daisy Long says she would not want a feeding tube (although we both agreed that she would likely just pull this out anyway) and this would not improve her QOL. Daisy Long is very tearful but understands  that her mother is at EOL. She says her mother continues to decline daily and desires for comfort care at this point. We discussed transition to hospice facility for EOL care and focus on comfort with prognosis < 2 weeks expected; Daisy Long agrees. Emotional support provided.   Primary Decision Maker NEXT OF KIN daughter Daisy Long (4 children total but 1 died and 2 others not in touch and live out of state).     SUMMARY OF RECOMMENDATIONS   Comfort care Transition to hospice facility  Code Status/Advance Care Planning:  DNR   Symptom Management:   Agitation (when she is not sleeping she is agitated): Per RN/family agitated with any stimuli. Haldol ordered but I would likely recommend Ativan prn with history of alcohol abuse. Ativan added prn.   PRN meds for pain, SOB, and secretions added to ensure comfort.   Palliative Prophylaxis:   Aspiration, Bowel Regimen, Delirium Protocol, Frequent Pain Assessment, Oral Care and Turn Reposition  Additional Recommendations (Limitations, Scope, Preferences):  Full Comfort Care  Psycho-social/Spiritual:   Desire for further Chaplaincy support:yes  Additional Recommendations: Education on Hospice and Grief/Bereavement Support  Prognosis:   < 2 weeks  Discharge Planning: Hospice  facility      Primary Diagnoses: Present on Admission: . Toxic metabolic encephalopathy   I have reviewed the medical record, interviewed the patient and family, and examined the patient. The following aspects are pertinent.  Past Medical History:  Diagnosis Date  . Anxiety   . Hyperlipidemia   . Hypertension   . Thyroid disease    Social History   Socioeconomic History  . Marital status: Widowed    Spouse name: Not on file  . Number of children: 3  . Years of education: Not on file  . Highest education level: Not on file  Occupational History  . Occupation: retired as IT trainer  Social Needs  . Financial resource strain: Not on  file  . Food insecurity:    Worry: Not on file    Inability: Not on file  . Transportation needs:    Medical: Not on file    Non-medical: Not on file  Tobacco Use  . Smoking status: Current Some Day Smoker    Types: Cigarettes  . Smokeless tobacco: Never Used  Substance and Sexual Activity  . Alcohol use: Yes  . Drug use: No  . Sexual activity: Not on file  Lifestyle  . Physical activity:    Days per week: Not on file    Minutes per session: Not on file  . Stress: Not on file  Relationships  . Social connections:    Talks on phone: Not on file    Gets together: Not on file    Attends religious service: Not on file    Active member of club or organization: Not on file    Attends meetings of clubs or organizations: Not on file    Relationship status: Not on file  Other Topics Concern  . Not on file  Social History Narrative   Has living will.    Daughter Daisy Long has health care POA.   Would accept resuscitation but would not want prolonged artificial ventilation   Would not want feeding tube if cognitively unaware               Family History  Problem Relation Age of Onset  . Coronary artery disease Mother   . Coronary artery disease Daughter   . Breast cancer Maternal Aunt   . Breast cancer Maternal Grandmother    Scheduled Meds: . aspirin EC  81 mg Oral Daily  . enoxaparin (LOVENOX) injection  40 mg Subcutaneous Q24H  . feeding supplement (ENSURE ENLIVE)  237 mL Oral TID BM  . folic acid  1 mg Oral Daily  . levothyroxine  25 mcg Oral QAC breakfast  . multivitamin with minerals  1 tablet Oral Daily  . tamsulosin  0.4 mg Oral Daily  . thiamine  100 mg Oral Daily   Or  . thiamine  100 mg Intravenous Daily   Continuous Infusions: . sodium chloride 60 mL/hr at 07/24/18 2011  .  ceFAZolin (ANCEF) IV Stopped (07/25/18 0748)  . potassium PHOSPHATE IVPB (in mmol)     PRN Meds:.albuterol, haloperidol lactate, HYDROcodone-acetaminophen, ondansetron **OR**  ondansetron (ZOFRAN) IV, polyethylene glycol Allergies  Allergen Reactions  . Levothyroxine Other (See Comments)    Increased blood pressure  . Triamterene-Hctz    Review of Systems  Unable to perform ROS: Acuity of condition    Physical Exam  Constitutional: She appears well-developed.  HENT:  Head: Normocephalic and atraumatic.  Cardiovascular: Normal rate.  Pulmonary/Chest: Effort normal. No accessory muscle usage. No tachypnea. No respiratory distress.  Abdominal: Normal appearance.  Neurological:  Somnolent  Nursing note and vitals reviewed.   Vital Signs: BP (!) 118/94   Pulse 80   Temp 98.5 F (36.9 C) (Oral)   Resp 16   Ht 5' 1"  (1.549 m)   Wt 49.4 kg (109 lb)   SpO2 95%   BMI 20.60 kg/m  Pain Scale: PAINAD POSS *See Group Information*: 2-Acceptable,Slightly drowsy, easily aroused Pain Score: Asleep   SpO2: SpO2: 95 % O2 Device:SpO2: 95 % O2 Flow Rate: .   IO: Intake/output summary:   Intake/Output Summary (Last 24 hours) at 07/25/2018 0923 Last data filed at 07/25/2018 0748 Gross per 24 hour  Intake 1370 ml  Output 575 ml  Net 795 ml    LBM: Last BM Date: 07/24/18 Baseline Weight: Weight: 49.4 kg (109 lb) Most recent weight: Weight: 49.4 kg (109 lb)     Palliative Assessment/Data: 20%     Time Total: 70 min  Greater than 50%  of this time was spent counseling and coordinating care related to the above assessment and plan.  Signed by: Vinie Sill, NP Palliative Medicine Team Pager # [phone] (M-F 8a-5p) Team Phone # [phone] (Nights/Weekends)

## 2018-07-25 NOTE — Consult Note (Signed)
PHARMACY CONSULT NOTE   Pharmacy Consult for Electrolyte Monitoring  Indication: High Risk for Refeeding Syndrome  LABS: Potassium (mmol/L)  Date Value  07/25/2018 3.6  03/01/2014 3.8   Magnesium (mg/dL)  Date Value  40/98/119108/04/2018 1.9  03/01/2014 1.6 (L)   Phosphorus (mg/dL)  Date Value  47/82/956208/04/2018 2.5   Calcium (mg/dL)  Date Value  13/08/657808/04/2018 7.2 (L)   Calcium, Total (mg/dL)  Date Value  46/96/295203/11/2014 8.6   Albumin (g/dL)  Date Value  84/13/244008/02/2018 2.6 (L)  10/30/2013 3.5  ]  Estimated Creatinine Clearance: 44.4 mL/min (A) (by C-G formula based on SCr of 0.42 mg/dL (L)).    Assessment: Pharmacy consulted for electrolyte monitoring and replacement in 77 yo female at risk for refeeding syndrome.   Goal of Therapy:  Electrolytes WNL  Plan:  8/5 AM   K: 3.6   Phos: 2.5   Mg: 1.9    Phos trending down/borderline. Will order Potassium phosphate 10 mmol IV x 1. Patient not eating and refusing meds. Will recheck electrolytes with AM labs and continue to replace as needed.   Angelique BlonderMerrill,Clever Geraldo A, PharmD, BCPS Clinical Pharmacist 07/25/2018 8:50 AM

## 2018-07-25 NOTE — Progress Notes (Signed)
New hospice home referral received fro [place] following a Palliative Medicine consult. Patient is a 49 year ol woman admitted to [facility] on 2/90 with metabolic encephalopathy. She received treatment, but despite medical interventions she has continued to decline. Palliative Medicine was consulted and met with her daughter Daisy Long who has chosen to focus on comfort with transfer to the Hospice home. Patient seen lying in bed, appeared pale and unwell. She is not eating, refusing medications and has had some periods of agitation. She did not  appear to be in any distress. Writer met in the family waiting room with Daisy Long to initiate Education regarding hospice services, philosophy and team approach to care with good understanding voiced. Questions answered, consents signed. Patient information faxed to referral, report called to the [place], EMS notified for 5 pm pick up. Hospital care team all updated. Signed DNR in place in discharge packet. Thank you for the opportunity to be involved in the care of this patient and her family. Daisy Shanks RN, BSN, [facility], hospital Liaison [phone]

## 2018-07-25 NOTE — Care Management Important Message (Signed)
Important Message  Patient Details  Name: Daisy MimesJanice M Zion MRN: [id number] Date of Birth: 10/17/1941   Medicare Important Message Given:  Yes Daughter, Robby Sermonaulette Arsenault signed form as patient is transitioning to comfort care  with plans to go to the Hospice home.    Olegario MessierKathy A Erman Thum 07/25/2018, 1:41 PM

## 2018-09-20 DEATH — deceased

## 2019-09-04 ENCOUNTER — Telehealth: Payer: Self-pay

## 2019-09-04 NOTE — Telephone Encounter (Signed)
I left a detailed message on Daisy Long's voice mail letting her know request was sent to [facility].  I gave her the phone number to [facility] for more information.

## 2019-09-04 NOTE — Telephone Encounter (Signed)
Daisy Long with [employer] left v/m requesting status of records requested on 08/04/19 and pd for records by credit card on 08/17/19.Please advise.

## 2020-05-14 IMAGING — MR MR HEAD W/O CM
8 of 10 series · 34 of 48 positions shown · non-contrast
Comparison: Prior CT from 07/19/2018

CLINICAL DATA: Initial evaluation for acute confusion.

EXAM:
MRI HEAD WITHOUT CONTRAST
TECHNIQUE: Multiplanar, multiecho pulse sequences of the brain and surrounding
structures were obtained without intravenous contrast.

[Series 2: GRE · sagittal · 5.0mm · 0.45mm/px · 4 of 21 slices shown (1 of 2)]
[im 1/21]
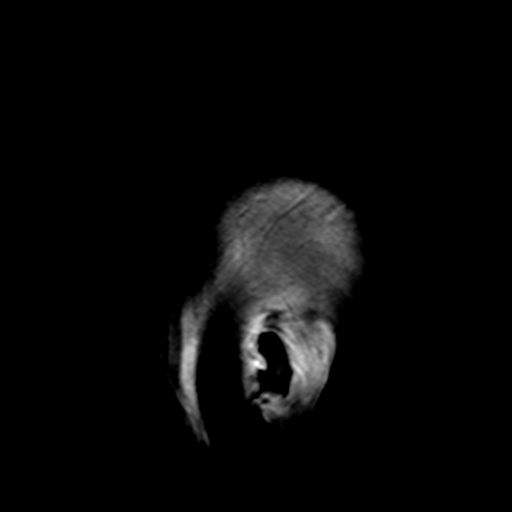
[im 7/21]
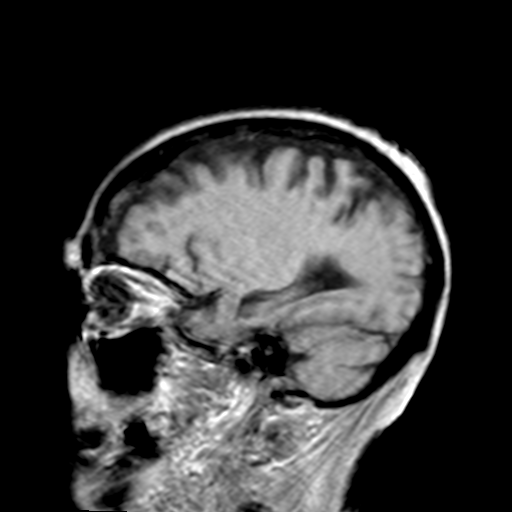
[im 14/21]
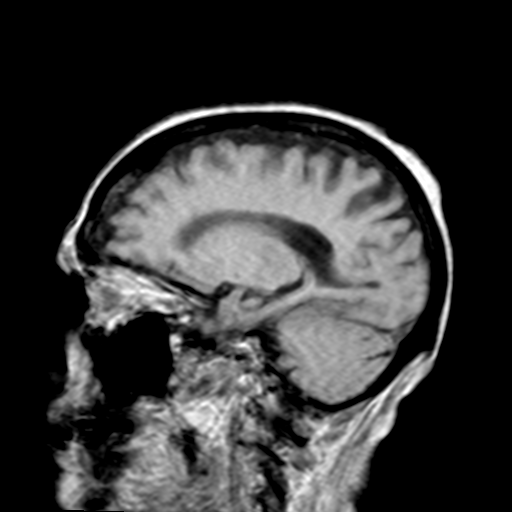
[im 21/21]
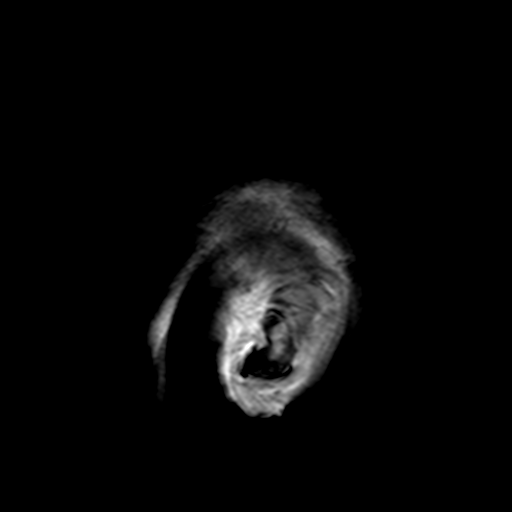

[Series 4: DWI · axial · 3.0mm · 1.80mm/px · z∈[-64,+75]mm · 6 of 46 slices shown (1 of 2)]
[im 1/46]
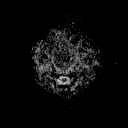
[im 10/46]
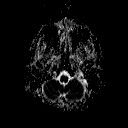
[im 19/46]
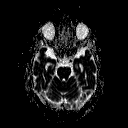
[im 28/46]
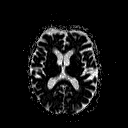
[im 37/46]
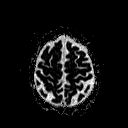
[im 46/46]
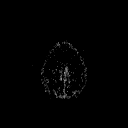

[Series 6: DWI · coronal · 3.0mm · 1.80mm/px · 6 of 42 slices shown (2 of 2)]
[im 1/42]
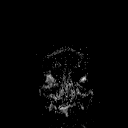
[im 9/42]
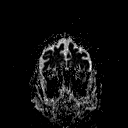
[im 17/42]
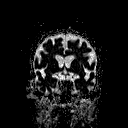
[im 25/42]
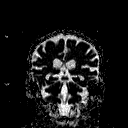
[im 33/42]
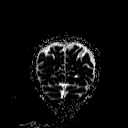
[im 42/42]
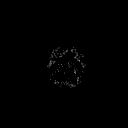

[Series 7: T2 · axial · 5.0mm · 0.45mm/px · z∈[-54,+80]mm · 3 of 22 slices shown (1 of 3)]
[im 1/22]
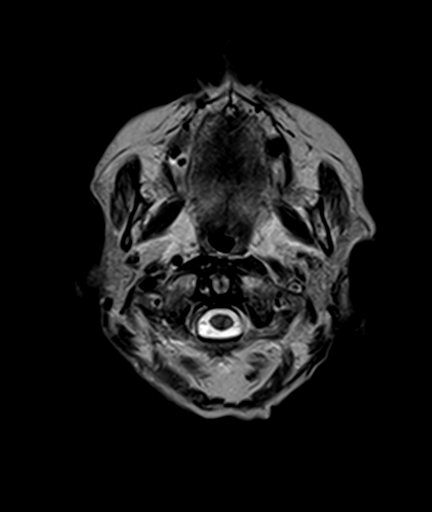
[im 11/22]
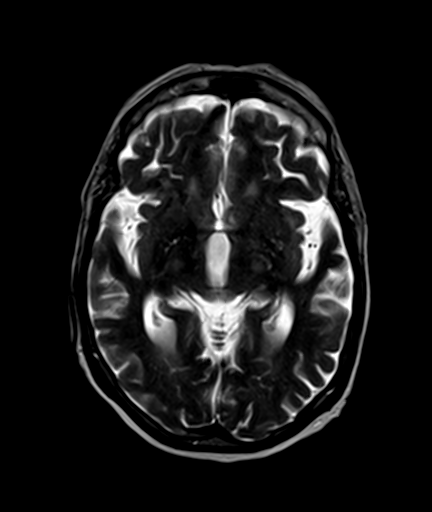
[im 22/22]
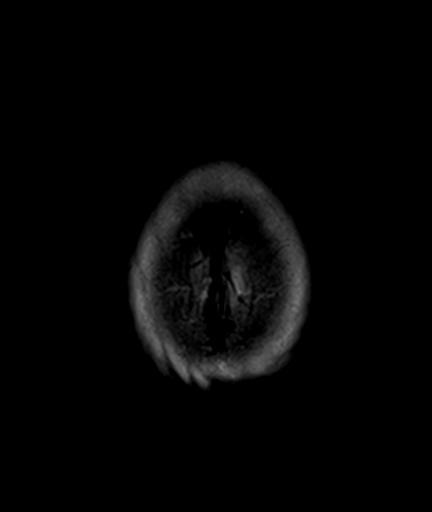

[Series 8: FLAIR · axial · 3.0mm · 0.45mm/px · z∈[-63,+89]mm · 7 of 53 slices shown]
[im 1/53]
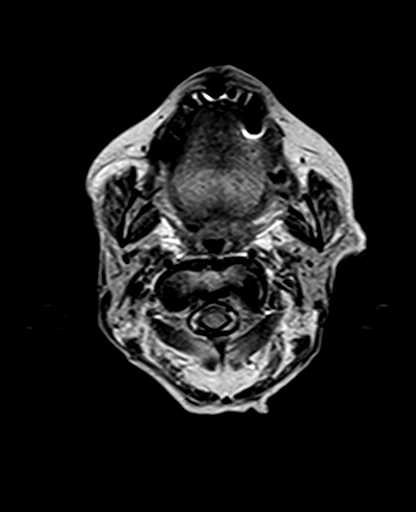
[im 9/53]
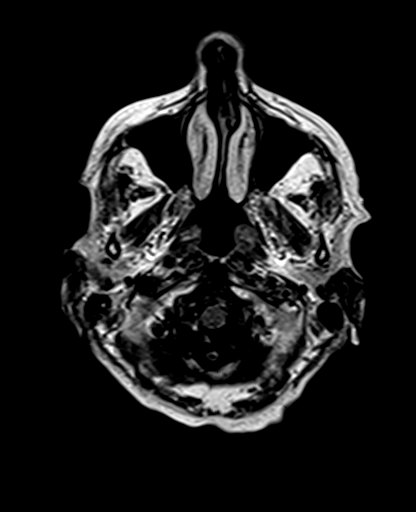
[im 18/53]
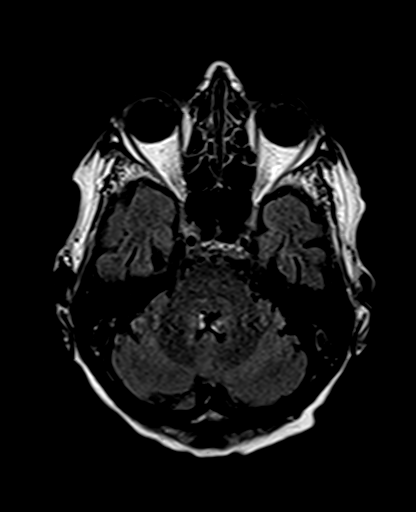
[im 27/53]
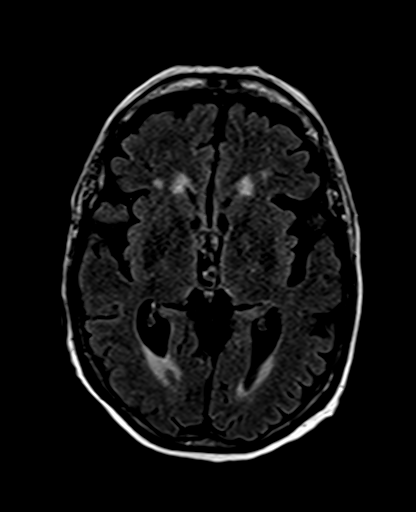
[im 35/53]
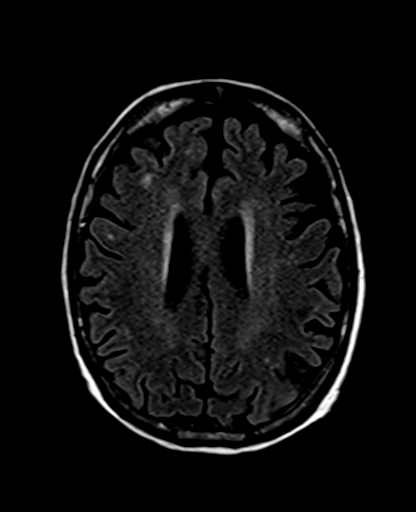
[im 44/53]
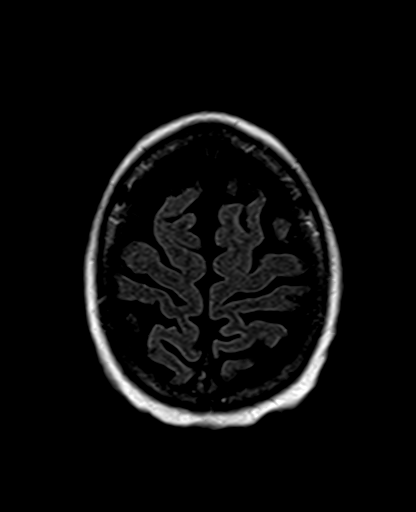
[im 53/53]
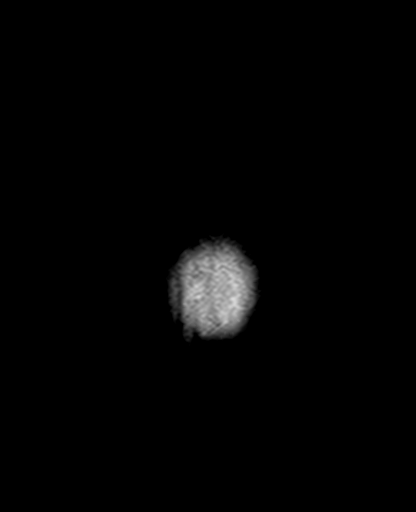

[Series 9: T2 · axial · 5.0mm · 1.20mm/px · z∈[-52,+81]mm · 3 of 22 slices shown (2 of 3)]
[im 1/22]
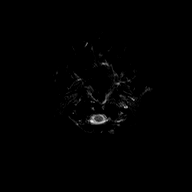
[im 11/22]
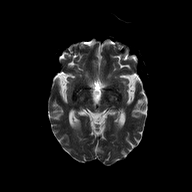
[im 22/22]
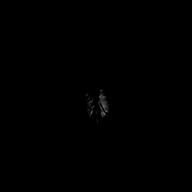

[Series 10: GRE · axial · 5.0mm · 0.45mm/px · z∈[-62,+15]mm · 2 of 25 slices shown (2 of 2)]
[im 1/25]
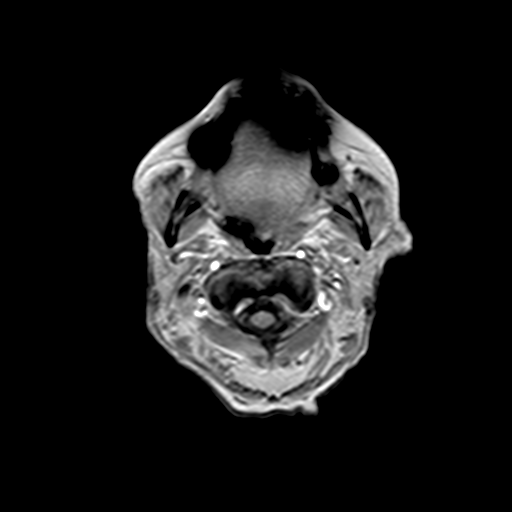
[im 13/25]
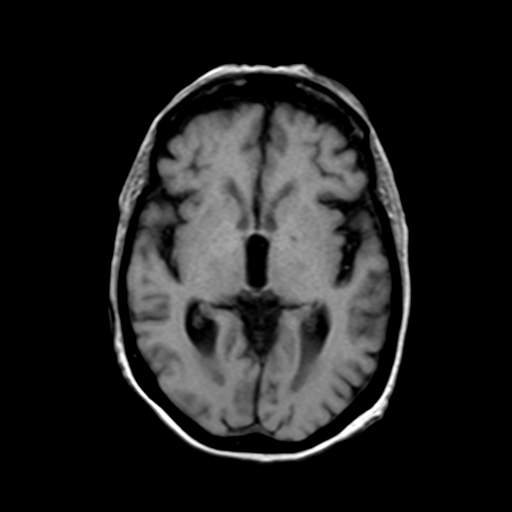

[Series 11: T2 · coronal · 5.0mm · 0.43mm/px · 3 of 25 slices shown (3 of 3)]
[im 1/25]
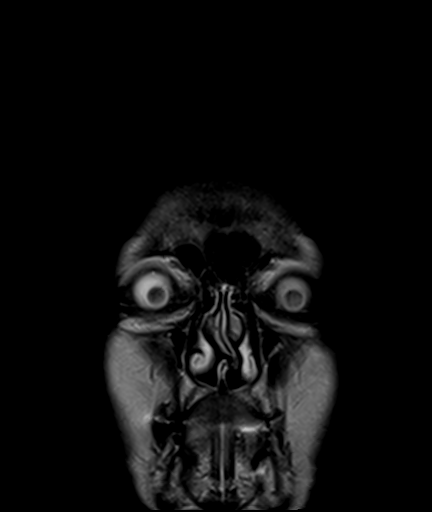
[im 13/25]
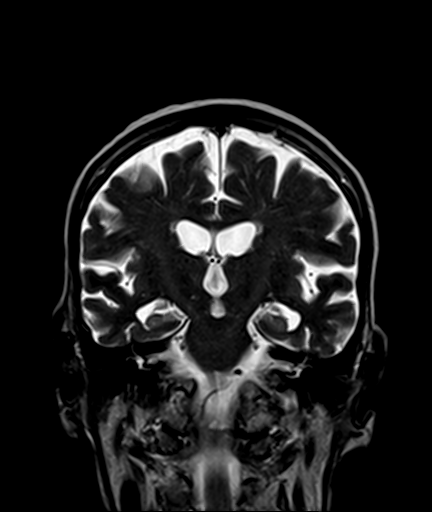
[im 25/25]
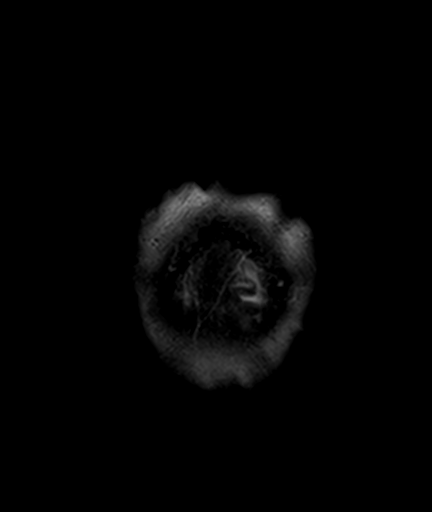

[34 of 48 positions shown; findings below may reference images not displayed]

FINDINGS: Brain: Generalized age-related cerebral atrophy. Patchy T2/FLAIR
hyperintensity within the periventricular and deep white matter both
cerebral hemispheres most consistent with chronic small vessel
ischemic disease, mild in nature.

No abnormal foci of restricted diffusion to suggest acute or
subacute ischemia. Gray-white matter differentiation maintained. No
encephalomalacia to suggest chronic infarction. No evidence for
acute or chronic intracranial hemorrhage.

No mass lesion, midline shift or mass effect. No hydrocephalus. No
extra-axial fluid collection. Pituitary gland within normal limits.

Vascular: Major intracranial vascular flow voids maintained

Skull and upper cervical spine: Craniocervical junction normal.
Upper cervical spine grossly within normal limits. Bone marrow
signal intensity normal. No scalp soft tissue abnormality.

Sinuses/Orbits: Globes and orbital soft tissues within normal
limits. Paranasal sinuses are clear. Trace opacity right mastoid air
cells, of doubtful significance. Inner ear structures normal.

Other: None.
IMPRESSION: 1. No acute intracranial abnormality.
2. Age-related cerebral atrophy with mild chronic small vessel
ischemic disease.
